# Patient Record
Sex: Male | Born: 1960
Health system: Southern US, Community
[De-identification: ages and names within clinical notes are randomized; demographics above are authoritative.]

## PROBLEM LIST (undated history)

## (undated) DIAGNOSIS — E119 Type 2 diabetes mellitus without complications: Secondary | ICD-10-CM

## (undated) DIAGNOSIS — F419 Anxiety disorder, unspecified: Secondary | ICD-10-CM

## (undated) DIAGNOSIS — I10 Essential (primary) hypertension: Secondary | ICD-10-CM

## (undated) DIAGNOSIS — T7840XA Allergy, unspecified, initial encounter: Secondary | ICD-10-CM

## (undated) HISTORY — DX: Essential (primary) hypertension: I10

## (undated) HISTORY — PX: HERNIA REPAIR: SHX51

## (undated) HISTORY — DX: Allergy, unspecified, initial encounter: T78.40XA

## (undated) HISTORY — PX: NASAL SINUS SURGERY: SHX719

## (undated) HISTORY — PX: PILONIDAL CYST / SINUS EXCISION: SUR543

## (undated) HISTORY — DX: Anxiety disorder, unspecified: F41.9

---

## 2000-08-24 ENCOUNTER — Emergency Department (HOSPITAL_COMMUNITY): Admission: EM | Admit: 2000-08-24 | Discharge: 2000-08-25 | Payer: Self-pay | Admitting: Emergency Medicine

## 2001-09-04 ENCOUNTER — Encounter: Payer: Self-pay | Admitting: Otolaryngology

## 2001-09-04 ENCOUNTER — Encounter: Admission: RE | Admit: 2001-09-04 | Discharge: 2001-09-04 | Payer: Self-pay | Admitting: Otolaryngology

## 2004-04-11 ENCOUNTER — Ambulatory Visit: Payer: Self-pay | Admitting: Internal Medicine

## 2005-12-16 ENCOUNTER — Emergency Department (HOSPITAL_COMMUNITY): Admission: EM | Admit: 2005-12-16 | Discharge: 2005-12-16 | Payer: Self-pay | Admitting: *Deleted

## 2011-04-11 ENCOUNTER — Ambulatory Visit (INDEPENDENT_AMBULATORY_CARE_PROVIDER_SITE_OTHER): Payer: BC Managed Care – PPO | Admitting: Internal Medicine

## 2011-04-11 DIAGNOSIS — R05 Cough: Secondary | ICD-10-CM

## 2011-04-11 DIAGNOSIS — R059 Cough, unspecified: Secondary | ICD-10-CM

## 2011-04-11 DIAGNOSIS — J329 Chronic sinusitis, unspecified: Secondary | ICD-10-CM

## 2011-04-11 DIAGNOSIS — R51 Headache: Secondary | ICD-10-CM

## 2011-04-11 DIAGNOSIS — J019 Acute sinusitis, unspecified: Secondary | ICD-10-CM

## 2011-04-11 MED ORDER — AZITHROMYCIN 500 MG PO TABS: 500.0000 mg | ORAL_TABLET | Freq: Every day | ORAL | Status: AC

## 2011-04-11 MED ORDER — HYDROCODONE-ACETAMINOPHEN 7.5-500 MG/15ML PO SOLN: 5.0000 mL | Freq: Four times a day (QID) | ORAL | Status: AC | PRN

## 2011-04-11 NOTE — Progress Notes (Signed)
  Subjective:    Patient ID: Austin Kent, male    DOB: Feb 24, 1961, 51 y.o.   MRN: [id number]  HPI Last week had fever and a cold. Improved, then sinuses started aching and nose worse congested. Has cough but no fever , SOB,CP. Discharge is now green and am sputum is green.   Review of Systems    Neg. Objective:   Physical Exam  Constitutional: He is oriented to person, place, and time. Vital signs are normal. He appears well-developed and well-nourished.       obese  HENT:  Nose: Mucosal edema, rhinorrhea and sinus tenderness present. Right sinus exhibits maxillary sinus tenderness and frontal sinus tenderness. Left sinus exhibits maxillary sinus tenderness and frontal sinus tenderness.  Cardiovascular: Normal rate, regular rhythm and normal heart sounds.   Pulmonary/Chest: Effort normal and breath sounds normal.  Neurological: He is alert and oriented to person, place, and time.  Skin: Skin is warm and dry.  Psychiatric: He has a normal mood and affect.          Assessment & Plan:  Sinusitis Cough  Zithromax 500 5d Lortab elixir 6oz  Resp. Care given  RTC prn 3-5d, do CBC and CXR

## 2012-03-18 ENCOUNTER — Ambulatory Visit (INDEPENDENT_AMBULATORY_CARE_PROVIDER_SITE_OTHER): Payer: BC Managed Care – PPO | Admitting: Family Medicine

## 2012-03-18 VITALS — BP 155/105 | HR 90 | Temp 98.2°F | Resp 16 | Ht 71.0 in | Wt 248.0 lb

## 2012-03-18 DIAGNOSIS — F411 Generalized anxiety disorder: Secondary | ICD-10-CM

## 2012-03-18 DIAGNOSIS — I10 Essential (primary) hypertension: Secondary | ICD-10-CM

## 2012-03-18 DIAGNOSIS — F419 Anxiety disorder, unspecified: Secondary | ICD-10-CM

## 2012-03-18 MED ORDER — CITALOPRAM HYDROBROMIDE 20 MG PO TABS: 20.0000 mg | ORAL_TABLET | Freq: Every day | ORAL | Status: DC

## 2012-03-18 MED ORDER — CHLORTHALIDONE 25 MG PO TABS: 25.0000 mg | ORAL_TABLET | Freq: Every day | ORAL | Status: DC

## 2012-03-18 NOTE — Progress Notes (Signed)
Patient ID: Austin Kent MRN: [id number], DOB: 13-Jun-1960, 52 y.o. Date of Encounter: 03/18/2012, 10:31 AM  Primary Physician: No primary provider on file.  Chief Complaint: HTN  HPI: 52 y.o. year old male with history below presents for hypertension follow up.  He took blood pressure medicine several years ago but developed a rash and, since he lost weight and exercised, he stopped the medicine.  Nevertheless, he gained weight back Teaches music at [employer]  No CP, HA, visual changes, or focal deficits.   Also, he is having problems with social anxiety and feels stress about home and work.  His wife, son, and daughter all take anxiety meds. Sleeping well  Past Medical History  Diagnosis Date  . Allergy   . Anxiety   . Hypertension      Home Meds: Prior to Admission medications   Not on File    Allergies:  Allergies  Allergen Reactions  . Amoxicillin     History   Social History  . Marital Status: Married    Spouse Name: N/A    Number of Children: N/A  . Years of Education: N/A   Occupational History  . Not on file.   Social History Main Topics  . Smoking status: Never Smoker   . Smokeless tobacco: Not on file  . Alcohol Use: Not on file  . Drug Use: Not on file  . Sexually Active: Not on file   Other Topics Concern  . Not on file   Social History Narrative  . No narrative on file     Family History  Problem Relation Age of Onset  . Hypertension Mother   . Anxiety disorder Father   . Anxiety disorder Daughter   . Anxiety disorder Son     Review of Systems: Constitutional: negative for chills, fever, night sweats, weight changes, or fatigue  HEENT: negative for vision changes, hearing loss, congestion, rhinorrhea, ST, epistaxis, or sinus pressure Cardiovascular: negative for chest pain, palpitations, or DOE Respiratory: negative for hemoptysis, wheezing, shortness of breath, or cough Abdominal: negative for abdominal pain,  nausea, vomiting, diarrhea, or constipation Dermatological: negative for rash Neurologic: negative for headache, dizziness, or syncope All other systems reviewed and are otherwise negative with the exception to those above and in the HPI.   Physical Exam: Blood pressure 155/105, pulse 90, temperature 98.2 F (36.8 C), temperature source Oral, resp. rate 16, height 5\' 11"  (1.803 m), weight 248 lb (112.492 kg), SpO2 98.00%., Body mass index is 34.59 kg/(m^2). General: Well developed, well nourished, in no acute distress. Head: Normocephalic, atraumatic, eyes without discharge, sclera non-icteric, nares are without discharge. Bilateral auditory canals clear, TM's are without perforation, pearly grey and translucent with reflective cone of light bilaterally. Oral cavity moist, posterior pharynx without exudate, erythema, peritonsillar abscess, or post nasal drip.  Neck: Supple. No thyromegaly. Full ROM. No lymphadenopathy. No carotid bruits. Lungs: Clear bilaterally to auscultation without wheezes, rales, or rhonchi. Breathing is unlabored. Heart: RRR with S1 S2. No murmurs, rubs, or gallops appreciated.  Abdomen: Soft, non-tender, non-distended with normoactive bowel sounds. No hepatosplenomegaly. No rebound/guarding. No obvious abdominal masses. Msk:  Strength and tone normal for age. Extremities/Skin: Warm and dry. No clubbing or cyanosis. No edema. No rashes or suspicious lesions. Distal pulses 2+ and equal bilaterally. Neuro: Alert and oriented X 3. Moves all extremities spontaneously. Gait is normal. CNII-XII grossly in tact. DTR 2+, cerebellar function intact. Rhomberg normal. Psych:  Responds to questions appropriately with a normal affect.  ASSESSMENT AND PLAN:  52 y.o. year old male with hypertension and anxiety. -  Signed, Elvina Sidle, MD 03/18/2012 10:31 AM

## 2012-03-18 NOTE — Patient Instructions (Addendum)
Social Anxiety Disorder Almost everyone can feel some degree of discomfort in a given social situation. However, when you feel extreme fear of social encounters and it begins to interfere with your daily functioning, you have social anxiety disorder. There are two types of this disorder.  If you have the first type, you are extremely anxious in only a few specific situations. For instance, you become anxious when answering a question out loud in class or presenting at work.  If you have the second type, you experience overwhelming worry in most or all social experiences. This may include everything from going to a doctor's appointment, to eating in a restaurant, to entering a crowded room. When this disorder happens in very young children, it may be from a new babysitter or stranger that the child is not used to. In the very young it may show up as crying, tantrums, or withdrawal.  Most adults with social anxiety were shy and timid as children. If left untreated, these adults may appear quiet and passive in social situations. They may be highly sensitive to the criticism and disapproval of others. They may have no close friends outside of first degree relatives. They may be fearful of saying or doing something foolish or becoming emotional in front of others. As a result of these fears, they may avoid most social encounters and select jobs and personal activities that allow them to isolate themselves from others.  CAUSES  This disorder can result from the combination of several factors.   Your genetic makeup affects how sensitive you are and how much stress you can tolerate.  How you were raised as a child also plays a part. Research suggests that children raised with overprotective parents, excessive expectations, overly critical parents, low assertiveness, and/or emotional insecurity have increased feelings of anxiety.  A traumatic life event can also contribute to social anxiety; for example, being  pointed out and shamed in public or being repeatedly bullied. SYMPTOMS  This disorder is characterized by a fear of social situations. The anxiety is marked by:  Apprehension.  Nervousness.  A feeling of unease, worry, or tension. Anxiety may also be reflected in:  Blushing.  Restlessness.  Trembling.  Shortness of breath.  Sweating.  Muscle tics and twitches. At higher levels of anxiety, there also may be:  Increased heart rate.  A rise in blood pressure.  Rapid breathing.  Muscle tension. Experiencing these uncomfortable symptoms often enough can cause a person to avoid social situations. This can cause many problems in the anxiety sufferer's life.  TREATMENT  There are many types of treatment available for social anxiety disorder.  Group therapy allows you to see that you are not alone with these problems.  Individual therapy helps you address anxiety issues with a caring professional.  Relaxation techniques may be used as tools to help you overcome fear.  Hypnosis may help change the way you think about social settings.  Numerous medications are available that your caregiver can prescribe to help during a difficult time. Medications can be used for a brief period of time. The goal of this treatment is to help recondition you so that once you quit taking the medications, your anxiety will not return. PROGNOSIS  Social anxiety disorder is a common problem that is very treatable. Individuals who participate in treatment have a very high success rate. When treated properly, the prognosis is very good to excellent. Document Released: 01/11/2005 Document Revised: 05/07/2011 Document Reviewed: 01/07/2007 [facility] Patient Information 2013 [place], [place].  Hypertension As your heart beats, it forces blood through your arteries. This force is your blood pressure. If the pressure is too high, it is called hypertension (HTN) or high blood pressure. HTN is dangerous because  you may have it and not know it. High blood pressure may mean that your heart has to work harder to pump blood. Your arteries may be narrow or stiff. The extra work puts you at risk for heart disease, stroke, and other problems.  Blood pressure consists of two numbers, a higher number over a lower, 110/72, for example. It is stated as "110 over 72." The ideal is below 120 for the top number (systolic) and under 80 for the bottom (diastolic). Write down your blood pressure today. You should pay close attention to your blood pressure if you have certain conditions such as:  Heart failure.  Prior heart attack.  Diabetes  Chronic kidney disease.  Prior stroke.  Multiple risk factors for heart disease. To see if you have HTN, your blood pressure should be measured while you are seated with your arm held at the level of the heart. It should be measured at least twice. A one-time elevated blood pressure reading (especially in the Emergency Department) does not mean that you need treatment. There may be conditions in which the blood pressure is different between your right and left arms. It is important to see your caregiver soon for a recheck. Most people have essential hypertension which means that there is not a specific cause. This type of high blood pressure may be lowered by changing lifestyle factors such as:  Stress.  Smoking.  Lack of exercise.  Excessive weight.  Drug/tobacco/alcohol use.  Eating less salt. Most people do not have symptoms from high blood pressure until it has caused damage to the body. Effective treatment can often prevent, delay or reduce that damage. TREATMENT  When a cause has been identified, treatment for high blood pressure is directed at the cause. There are a large number of medications to treat HTN. These fall into several categories, and your caregiver will help you select the medicines that are best for you. Medications may have side effects. You should  review side effects with your caregiver. If your blood pressure stays high after you have made lifestyle changes or started on medicines,   Your medication(s) may need to be changed.  Other problems may need to be addressed.  Be certain you understand your prescriptions, and know how and when to take your medicine.  Be sure to follow up with your caregiver within the time frame advised (usually within two weeks) to have your blood pressure rechecked and to review your medications.  If you are taking more than one medicine to lower your blood pressure, make sure you know how and at what times they should be taken. Taking two medicines at the same time can result in blood pressure that is too low. SEEK IMMEDIATE MEDICAL CARE IF:  You develop a severe headache, blurred or changing vision, or confusion.  You have unusual weakness or numbness, or a faint feeling.  You have severe chest or abdominal pain, vomiting, or breathing problems. MAKE SURE YOU:   Understand these instructions.  Will watch your condition.  Will get help right away if you are not doing well or get worse. Document Released: 02/12/2005 Document Revised: 05/07/2011 Document Reviewed: 10/03/2007 [facility] Patient Information 2013 [place], [place].

## 2012-03-24 ENCOUNTER — Telehealth: Payer: Self-pay

## 2012-03-24 NOTE — Addendum Note (Signed)
Addended by: Godfrey Pick on: 03/24/2012 04:06 PM   Modules accepted: Orders

## 2012-03-24 NOTE — Telephone Encounter (Signed)
Note states pt has been on HTN medication in the past, what was he on previously?

## 2012-03-24 NOTE — Telephone Encounter (Signed)
I called patient and he is c/o dizziness and heart palpatations, dry mouth. He d/c the Chlorthalidone and feels much better, symptoms have resolved. Patient wants to know if we can recommend an alternative. Please advise.

## 2012-03-24 NOTE — Telephone Encounter (Signed)
Dr. Milus Glazier: patient was in to see you Tuesday of last week and was prescribed a medication that is giving him several unpleasant side effects. Please call him to advise at [phone]

## 2012-03-25 MED ORDER — LISINOPRIL 20 MG PO TABS: 20.0000 mg | ORAL_TABLET | Freq: Every day | ORAL | Status: DC

## 2012-03-25 NOTE — Telephone Encounter (Signed)
He took meds 3 years ago but does not know name of meds.

## 2012-03-25 NOTE — Telephone Encounter (Signed)
Ok I have sent lisinopril 20mg  to the pharmacy.  Let's have him try this and follow up with [place] in 4-6 weeks so we can make sure BP is improving and adjust the dose if necessary

## 2012-03-25 NOTE — Telephone Encounter (Signed)
Called patient to advise  °

## 2012-03-25 NOTE — Addendum Note (Signed)
Addended by: Godfrey Pick on: 03/25/2012 11:15 AM   Modules accepted: Orders

## 2012-07-28 ENCOUNTER — Other Ambulatory Visit: Payer: Self-pay | Admitting: Family Medicine

## 2012-10-18 ENCOUNTER — Other Ambulatory Visit: Payer: Self-pay | Admitting: Physician Assistant

## 2015-06-29 ENCOUNTER — Telehealth: Payer: Self-pay | Admitting: Emergency Medicine

## 2015-06-29 ENCOUNTER — Ambulatory Visit (INDEPENDENT_AMBULATORY_CARE_PROVIDER_SITE_OTHER): Payer: BLUE CROSS/BLUE SHIELD | Admitting: Physician Assistant

## 2015-06-29 ENCOUNTER — Ambulatory Visit (HOSPITAL_BASED_OUTPATIENT_CLINIC_OR_DEPARTMENT_OTHER)
Admission: RE | Admit: 2015-06-29 | Discharge: 2015-06-29 | Disposition: A | Discharge status: Home / Self Care | Payer: BLUE CROSS/BLUE SHIELD | Source: Ambulatory Visit | Attending: Physician Assistant | Admitting: Physician Assistant

## 2015-06-29 ENCOUNTER — Telehealth: Payer: Self-pay | Admitting: Physician Assistant

## 2015-06-29 VITALS — BP 150/80 | HR 63 | Temp 97.4°F | Resp 16 | Ht 71.0 in | Wt 248.0 lb

## 2015-06-29 DIAGNOSIS — R112 Nausea with vomiting, unspecified: Secondary | ICD-10-CM

## 2015-06-29 DIAGNOSIS — R110 Nausea: Secondary | ICD-10-CM | POA: Diagnosis not present

## 2015-06-29 DIAGNOSIS — K8020 Calculus of gallbladder without cholecystitis without obstruction: Secondary | ICD-10-CM | POA: Insufficient documentation

## 2015-06-29 DIAGNOSIS — Z7984 Long term (current) use of oral hypoglycemic drugs: Secondary | ICD-10-CM | POA: Diagnosis not present

## 2015-06-29 DIAGNOSIS — Z880 Allergy status to penicillin: Secondary | ICD-10-CM | POA: Diagnosis not present

## 2015-06-29 DIAGNOSIS — R1011 Right upper quadrant pain: Secondary | ICD-10-CM

## 2015-06-29 DIAGNOSIS — Z79899 Other long term (current) drug therapy: Secondary | ICD-10-CM | POA: Diagnosis not present

## 2015-06-29 DIAGNOSIS — N200 Calculus of kidney: Secondary | ICD-10-CM | POA: Insufficient documentation

## 2015-06-29 DIAGNOSIS — F419 Anxiety disorder, unspecified: Secondary | ICD-10-CM | POA: Diagnosis not present

## 2015-06-29 DIAGNOSIS — I10 Essential (primary) hypertension: Secondary | ICD-10-CM | POA: Diagnosis not present

## 2015-06-29 DIAGNOSIS — R630 Anorexia: Secondary | ICD-10-CM | POA: Diagnosis not present

## 2015-06-29 DIAGNOSIS — E119 Type 2 diabetes mellitus without complications: Secondary | ICD-10-CM | POA: Diagnosis not present

## 2015-06-29 LAB — POCT CBC
Granulocyte percent: 83 %G — AB (ref 37–80)
HCT, POC: 42.1 % — AB (ref 43.5–53.7)
HEMOGLOBIN: 15.3 g/dL (ref 14.1–18.1)
LYMPH, POC: 1.5 (ref 0.6–3.4)
MCH: 32 pg — AB (ref 27–31.2)
MCHC: 36.2 g/dL — AB (ref 31.8–35.4)
MCV: 88.4 fL (ref 80–97)
MID (cbc): 0.6 (ref 0–0.9)
MPV: 6.5 fL (ref 0–99.8)
POC GRANULOCYTE: 10.3 — AB (ref 2–6.9)
POC LYMPH PERCENT: 12.1 %L (ref 10–50)
POC MID %: 4.9 % (ref 0–12)
Platelet Count, POC: 236 10*3/uL (ref 142–424)
RBC: 4.76 M/uL (ref 4.69–6.13)
RDW, POC: 12.8 %
WBC: 12.4 10*3/uL — AB (ref 4.6–10.2)

## 2015-06-29 LAB — POC MICROSCOPIC URINALYSIS (UMFC)

## 2015-06-29 LAB — POCT URINALYSIS DIP (MANUAL ENTRY)
BILIRUBIN UA: NEGATIVE
BILIRUBIN UA: NEGATIVE
Glucose, UA: NEGATIVE
Leukocytes, UA: NEGATIVE
NITRITE UA: NEGATIVE
PH UA: 6
PROTEIN UA: NEGATIVE
Spec Grav, UA: 1.02
Urobilinogen, UA: 1

## 2015-06-29 MED ORDER — PROMETHAZINE HCL 25 MG/ML IJ SOLN: 50.0000 mg | Freq: Once | INTRAMUSCULAR | Status: DC

## 2015-06-29 MED ORDER — ONDANSETRON HCL 4 MG/2ML IJ SOLN
2.0000 mg | Freq: Once | INTRAMUSCULAR | Status: AC
  Administered 2015-06-29: 2 mg via INTRAMUSCULAR

## 2015-06-29 MED ORDER — KETOROLAC TROMETHAMINE 60 MG/2ML IM SOLN
60.0000 mg | Freq: Once | INTRAMUSCULAR | Status: AC
  Administered 2015-06-29: 60 mg via INTRAMUSCULAR

## 2015-06-29 NOTE — Telephone Encounter (Signed)
Spoke with patient and provided the results.  Will refer to general surgery for multiple gall stones. Pain remains controlled, he doesn't need to be seen urgently, as there is no GB wall thickening and no fluid. There is evidence of fatty liver and a stone 3-4 mm at the upper pole of the RIGHT kidney.  If pain recurs overnight, go to the ED.

## 2015-06-29 NOTE — Progress Notes (Signed)
Patient ID: Austin Kent, male     DOB: [birth year], 55 y.o.    MRN: [id number]  PCP: No primary care provider on file.  Chief Complaint  Patient presents with  . SIDE PAIN    rt, x today  . Nausea    Subjective:    HPI  Presents for evaluation of possible appendicitis. He is accompanied by his son.  This is the first visit at this facility.  The patient awoke this morning and noted some RUQ pain. It was mild, so he proceeded with his routine and took one of his sons to school. He planned to eat breakfast after dropping his son off, but developed nausea.  Both the pain and the nausea have worsened throughout the day.  No fever/chills. Previous hernia repair and pilonidal cyst excision. No previous pain like this or in this area. No indigestion/heartburn or increased belching. Has not vomited. No urinary burning, increased frequency or urgency. No hematuria.   Prior to Admission medications   Medication Sig Start Date End Date Taking? Authorizing Provider  citalopram (CELEXA) 20 MG tablet TAKE 1 TABLET BY MOUTH EVERY DAY 10/18/12  Yes Morrell RiddleSarah L Weber, PA-C  LOSARTAN POTASSIUM PO Take by mouth.   Yes Historical Provider, MD  METFORMIN HCL PO Take by mouth.   Yes Historical Provider, MD     Allergies  Allergen Reactions  . Amoxicillin      Patient Active Problem List   Diagnosis Date Noted  . Hypertension 03/18/2012     Family History  Problem Relation Age of Onset  . Hypertension Mother   . Anxiety disorder Father   . Anxiety disorder Daughter   . Anxiety disorder Son      Social History   Social History  . Marital Status: Married    Spouse Name: N/A  . Number of Children: N/A  . Years of Education: N/A   Occupational History  . Not on file.   Social History Main Topics  . Smoking status: Never Smoker   . Smokeless tobacco: Never Used  . Alcohol Use: No  . Drug Use: No  . Sexual Activity: Not on file   Other Topics Concern  . Not  on file   Social History Narrative        Review of Systems  Constitutional: Negative for fever and chills.  Respiratory: Negative for cough, shortness of breath and wheezing.   Cardiovascular: Negative for chest pain and palpitations.  Gastrointestinal: Positive for nausea and abdominal pain (RUQ). Negative for vomiting, diarrhea and constipation.  Genitourinary: Negative for dysuria, urgency, frequency and hematuria.  Musculoskeletal: Negative for myalgias and back pain.         Objective:  Physical Exam  Constitutional: He is oriented to person, place, and time. He appears well-developed and well-nourished. He is active and cooperative. He appears distressed.  BP 150/80 mmHg  Pulse 63  Temp(Src) 97.4 F (36.3 C) (Oral)  Resp 16  Ht 5\' 11"  (1.803 m)  Wt 248 lb (112.492 kg)  BMI 34.60 kg/m2  SpO2 100%  HENT:  Head: Normocephalic and atraumatic.  Right Ear: Hearing normal.  Left Ear: Hearing normal.  Eyes: Conjunctivae are normal. No scleral icterus.  Neck: Normal range of motion. Neck supple. No thyromegaly present.  Cardiovascular: Normal rate, regular rhythm and normal heart sounds.   Pulses:      Radial pulses are 2+ on the right side, and 2+ on the left side.  Pulmonary/Chest: Effort normal and  breath sounds normal.  Abdominal: Soft. Bowel sounds are normal. He exhibits no distension and no mass. There is no hepatosplenomegaly. There is tenderness in the right upper quadrant. There is no rebound and no guarding.  Lymphadenopathy:       Head (right side): No tonsillar, no preauricular, no posterior auricular and no occipital adenopathy present.       Head (left side): No tonsillar, no preauricular, no posterior auricular and no occipital adenopathy present.    He has no cervical adenopathy.       Right: No supraclavicular adenopathy present.       Left: No supraclavicular adenopathy present.  Neurological: He is alert and oriented to person, place, and time. No  sensory deficit.  Skin: Skin is warm and intact. No rash noted. He is diaphoretic. No cyanosis or erythema. Nails show no clubbing.  Psychiatric: He has a normal mood and affect. His speech is normal and behavior is normal.    Toradol and ondansetron administered IM with good benefit, near resolution of pain, complete resolution of nausea.   Results for orders placed or performed in visit on 06/29/15  POCT CBC  Result Value Ref Range   WBC 12.4 (A) 4.6 - 10.2 K/uL   Lymph, poc 1.5 0.6 - 3.4   POC LYMPH PERCENT 12.1 10 - 50 %L   MID (cbc) 0.6 0 - 0.9   POC MID % 4.9 0 - 12 %M   POC Granulocyte 10.3 (A) 2 - 6.9   Granulocyte percent 83.0 (A) 37 - 80 %G   RBC 4.76 4.69 - 6.13 M/uL   Hemoglobin 15.3 14.1 - 18.1 g/dL   HCT, POC 16.1 (A) 09.6 - 53.7 %   MCV 88.4 80 - 97 fL   MCH, POC 32.0 (A) 27 - 31.2 pg   MCHC 36.2 (A) 31.8 - 35.4 g/dL   RDW, POC 04.5 %   Platelet Count, POC 236 142 - 424 K/uL   MPV 6.5 0 - 99.8 fL  POCT urinalysis dipstick  Result Value Ref Range   Color, UA yellow yellow   Clarity, UA clear clear   Glucose, UA negative negative   Bilirubin, UA negative negative   Ketones, POC UA negative negative   Spec Grav, UA 1.020    Blood, UA trace-intact (A) negative   pH, UA 6.0    Protein Ur, POC negative negative   Urobilinogen, UA 1.0    Nitrite, UA Negative Negative   Leukocytes, UA Negative Negative  POCT Microscopic Urinalysis ([facility])  Result Value Ref Range   WBC,UR,HPF,POC None None WBC/hpf   RBC,UR,HPF,POC None None RBC/hpf   Bacteria None None, Too numerous to count   Mucus Present (A) Absent   Epithelial Cells, UR Per Microscopy Few (A) None, Too numerous to count cells/hpf          Assessment & Plan:  1. Right upper quadrant pain 2. Nausea and vomiting, intractability of vomiting not specified, unspecified vomiting type Suspect gallbladder disease. [place] now. - ondansetron (ZOFRAN) injection 2 mg; Inject 1 mL (2 mg total) into the muscle once. -  ketorolac (TORADOL) injection 60 mg; Inject 2 mLs (60 mg total) into the muscle once. - POCT CBC - POCT urinalysis dipstick - POCT Microscopic Urinalysis ([facility]) - US Abdomen Limited RUQ; Future    Fernande Bras, PA-C Physician Assistant-Certified [facility]

## 2015-06-29 NOTE — Telephone Encounter (Signed)
Pt scheduled for STAT RUQ U/S @ [facility]

## 2015-06-29 NOTE — Patient Instructions (Addendum)
Please proceed to [facility] [place] now. [address], [place], [place] Go to Financial traderadmitting and register. Then, follow directions to the Ultrasound Department.    IF you received an x-ray today, you will receive an invoice from [facility]. Please contact [facility] at [phone] with questions or concerns regarding your invoice.   IF you received labwork today, you will receive an invoice from [facility]. Please contact [facility] at [phone] with questions or concerns regarding your invoice.   Our billing staff will not be able to assist you with questions regarding bills from these companies.  You will be contacted with the lab results as soon as they are available. The fastest way to get your results is to activate your My Chart account. Instructions are located on the last page of this paperwork. If you have not heard from us regarding the results in 2 weeks, please contact this office.

## 2015-06-30 ENCOUNTER — Encounter (HOSPITAL_COMMUNITY): Payer: Self-pay

## 2015-06-30 ENCOUNTER — Telehealth: Payer: Self-pay

## 2015-06-30 ENCOUNTER — Emergency Department (HOSPITAL_COMMUNITY)
Admission: EM | Admit: 2015-06-30 | Discharge: 2015-06-30 | Disposition: A | Discharge status: Home / Self Care | Payer: BLUE CROSS/BLUE SHIELD | Attending: Emergency Medicine | Admitting: Emergency Medicine

## 2015-06-30 DIAGNOSIS — Z880 Allergy status to penicillin: Secondary | ICD-10-CM | POA: Insufficient documentation

## 2015-06-30 DIAGNOSIS — E119 Type 2 diabetes mellitus without complications: Secondary | ICD-10-CM | POA: Insufficient documentation

## 2015-06-30 DIAGNOSIS — R630 Anorexia: Secondary | ICD-10-CM | POA: Insufficient documentation

## 2015-06-30 DIAGNOSIS — I10 Essential (primary) hypertension: Secondary | ICD-10-CM | POA: Insufficient documentation

## 2015-06-30 DIAGNOSIS — F419 Anxiety disorder, unspecified: Secondary | ICD-10-CM | POA: Insufficient documentation

## 2015-06-30 DIAGNOSIS — K8020 Calculus of gallbladder without cholecystitis without obstruction: Secondary | ICD-10-CM | POA: Insufficient documentation

## 2015-06-30 DIAGNOSIS — Z79899 Other long term (current) drug therapy: Secondary | ICD-10-CM | POA: Insufficient documentation

## 2015-06-30 DIAGNOSIS — Z7984 Long term (current) use of oral hypoglycemic drugs: Secondary | ICD-10-CM | POA: Insufficient documentation

## 2015-06-30 HISTORY — DX: Type 2 diabetes mellitus without complications: E11.9

## 2015-06-30 LAB — CBC WITH DIFFERENTIAL/PLATELET
BASOS PCT: 0 %
Basophils Absolute: 0 10*3/uL (ref 0.0–0.1)
EOS ABS: 0.1 10*3/uL (ref 0.0–0.7)
EOS PCT: 1 %
HCT: 41.6 % (ref 39.0–52.0)
HEMOGLOBIN: 14.2 g/dL (ref 13.0–17.0)
Lymphocytes Relative: 6 %
Lymphs Abs: 1 10*3/uL (ref 0.7–4.0)
MCH: 31.3 pg (ref 26.0–34.0)
MCHC: 34.1 g/dL (ref 30.0–36.0)
MCV: 91.6 fL (ref 78.0–100.0)
Monocytes Absolute: 1.1 10*3/uL — ABNORMAL HIGH (ref 0.1–1.0)
Monocytes Relative: 7 %
NEUTROS PCT: 86 %
Neutro Abs: 13 10*3/uL — ABNORMAL HIGH (ref 1.7–7.7)
PLATELETS: 181 10*3/uL (ref 150–400)
RBC: 4.54 MIL/uL (ref 4.22–5.81)
RDW: 13 % (ref 11.5–15.5)
WBC: 15.2 10*3/uL — AB (ref 4.0–10.5)

## 2015-06-30 LAB — COMPREHENSIVE METABOLIC PANEL
ALBUMIN: 4 g/dL (ref 3.5–5.0)
ALK PHOS: 38 U/L (ref 38–126)
ALT: 29 U/L (ref 17–63)
ANION GAP: 9 (ref 5–15)
AST: 21 U/L (ref 15–41)
BUN: 17 mg/dL (ref 6–20)
CALCIUM: 9.3 mg/dL (ref 8.9–10.3)
CHLORIDE: 105 mmol/L (ref 101–111)
CO2: 25 mmol/L (ref 22–32)
CREATININE: 0.89 mg/dL (ref 0.61–1.24)
GFR calc non Af Amer: >60 mL/min (ref >60)
GLUCOSE: 159 mg/dL — AB (ref 65–99)
Potassium: 3.9 mmol/L (ref 3.5–5.1)
SODIUM: 139 mmol/L (ref 135–145)
Total Bilirubin: 1.4 mg/dL — ABNORMAL HIGH (ref 0.3–1.2)
Total Protein: 6.8 g/dL (ref 6.5–8.1)

## 2015-06-30 LAB — LIPASE, BLOOD: Lipase: 33 U/L (ref 11–51)

## 2015-06-30 MED ORDER — HYDROCODONE-ACETAMINOPHEN 5-325 MG PO TABS: 2.0000 | ORAL_TABLET | ORAL | Status: DC | PRN

## 2015-06-30 MED ORDER — ONDANSETRON HCL 4 MG PO TABS: 4.0000 mg | ORAL_TABLET | Freq: Four times a day (QID) | ORAL | Status: DC

## 2015-06-30 MED ORDER — KETOROLAC TROMETHAMINE 30 MG/ML IJ SOLN
30.0000 mg | Freq: Once | INTRAMUSCULAR | Status: AC
  Administered 2015-06-30: 30 mg via INTRAVENOUS
  Filled 2015-06-30: qty 1

## 2015-06-30 MED ORDER — SODIUM CHLORIDE 0.9 % IV BOLUS (SEPSIS)
1000.0000 mL | Freq: Once | INTRAVENOUS | Status: AC
  Administered 2015-06-30: 1000 mL via INTRAVENOUS

## 2015-06-30 MED ORDER — ONDANSETRON HCL 4 MG/2ML IJ SOLN
4.0000 mg | Freq: Once | INTRAMUSCULAR | Status: AC
  Administered 2015-06-30: 4 mg via INTRAVENOUS
  Filled 2015-06-30: qty 2

## 2015-06-30 MED ORDER — IBUPROFEN 800 MG PO TABS: 800.0000 mg | ORAL_TABLET | Freq: Three times a day (TID) | ORAL | Status: DC | PRN

## 2015-06-30 NOTE — Telephone Encounter (Signed)
Pt is wanting to talk with Chelle about his gallbladder issues   Best number [phone]

## 2015-06-30 NOTE — ED Notes (Signed)
MD at bedside. 

## 2015-06-30 NOTE — ED Notes (Signed)
Pt arrives ambulatory with c/o RUQ pain radiating into back. Pt states pain started yesterday and he went to UC and eventually got an US indicating gall stones and kidney stones.Pain wasrelioeved last night with pain meds(toradol) but returned in early hours of am.

## 2015-06-30 NOTE — ED Provider Notes (Signed)
CSN: [id number]     Arrival date & time 06/30/15  4098 History   First MD Initiated Contact with Patient 06/30/15 [phone]     Chief Complaint  Patient presents with  . Abdominal Pain     (Consider location/radiation/quality/duration/timing/severity/associated sxs/prior Treatment) HPI 55 y.o. male with a history of hypertension, diabetes, presents to the ED noting the onset of right upper quadrant abdominal pain radiating to his mid back yesterday morning. He has had associated nausea and non-bloody vomiting, but denies any diarrhea, hematuria, radiation of pain to his groin, nor hx of kidney stones. He was evaluated at an urgent care clinic yesterday with labs drawn significant for mild leukocytosis of 12.4, but other wise reassuring results, and was sent for an outpatient [place] of his RUQ. This resulted showing evidence of cholelithiasis without evidence of cholecystitis or obstruction. He was not rx'd any medications and was advised to follow up with surgery in clinic for further evaluation of his sx. He had complete resolution of his pain yesterday with a dose of toradol. He presents to the ED today noting the return of his RUQ pain sx and nausea this morning. He denies any fever, chills. No hx of prior abdominal surgery.      Past Medical History  Diagnosis Date  . Allergy   . Anxiety   . Hypertension   . Diabetes mellitus without complication [facility])    Past Surgical History  Procedure Laterality Date  . Hernia repair    . Pilonidal cyst / sinus excision    . Nasal sinus surgery     Family History  Problem Relation Age of Onset  . Hypertension Mother   . Anxiety disorder Father   . Anxiety disorder Daughter   . Anxiety disorder Son    Social History  Substance Use Topics  . Smoking status: Never Smoker   . Smokeless tobacco: Never Used  . Alcohol Use: No    Review of Systems  Constitutional: Positive for activity change and appetite change. Negative for fever and chills.  HENT:  Negative for sore throat.   Respiratory: Negative for chest tightness and shortness of breath.   Cardiovascular: Negative for chest pain.  Gastrointestinal: Positive for nausea, vomiting and abdominal pain. Negative for diarrhea, constipation and blood in stool.  Genitourinary: Negative for dysuria, frequency, hematuria, flank pain and penile pain.  Musculoskeletal: Negative for back pain and neck pain.  Skin: Negative for rash.  Neurological: Negative for dizziness, syncope, light-headedness and headaches.  All other systems reviewed and are negative.     Allergies  Amoxicillin  Home Medications   Prior to Admission medications   Medication Sig Start Date End Date Taking? Authorizing Provider  citalopram (CELEXA) 20 MG tablet TAKE 1 TABLET BY MOUTH EVERY DAY 10/18/12  Yes Morrell Riddle, PA-C  glimepiride (AMARYL) 2 MG tablet Take 2 mg by mouth daily with breakfast.   Yes Historical Provider, MD  losartan-hydrochlorothiazide (HYZAAR) 50-12.5 MG tablet Take 1 tablet by mouth daily. 06/27/15  Yes Historical Provider, MD  metFORMIN (GLUCOPHAGE) 1000 MG tablet Take 1,000 mg by mouth 2 (two) times daily. 06/27/15  Yes Historical Provider, MD  HYDROcodone-acetaminophen (NORCO/VICODIN) 5-325 MG tablet Take 2 tablets by mouth every 4 (four) hours as needed. 06/30/15   Francoise Ceo, DO  ibuprofen (ADVIL,MOTRIN) 800 MG tablet Take 1 tablet (800 mg total) by mouth every 8 (eight) hours as needed for moderate pain. 06/30/15   Francoise Ceo, DO  LOSARTAN POTASSIUM PO Take  by mouth.    Historical Provider, MD  METFORMIN HCL PO Take by mouth.    Historical Provider, MD  ondansetron (ZOFRAN) 4 MG tablet Take 1 tablet (4 mg total) by mouth every 6 (six) hours. 06/30/15   Francoise CeoWarren S Salisa Broz, DO   BP 142/70 mmHg  Pulse 82  Temp(Src) 98.1 F (36.7 C) (Oral)  Resp 16  Ht 5\' 11"  (1.803 m)  Wt 107.956 kg  BMI 33.21 kg/m2  SpO2 97% Physical Exam  Constitutional: He is oriented to person, place, and time. He  appears well-developed and well-nourished. No distress.  HENT:  Head: Normocephalic and atraumatic.  Nose: Nose normal.  Mouth/Throat: Oropharynx is clear and moist.  Eyes: Conjunctivae and EOM are normal. Pupils are equal, round, and reactive to light.  Neck: Neck supple.  Cardiovascular: Normal rate, regular rhythm, normal heart sounds and intact distal pulses.   Pulmonary/Chest: Effort normal and breath sounds normal. He exhibits no tenderness.  Abdominal: Soft. Bowel sounds are normal. He exhibits no distension. There is tenderness in the right upper quadrant. There is positive Murphy's sign. There is no rigidity, no rebound, no guarding, no CVA tenderness and no tenderness at McBurney's point.  Musculoskeletal: He exhibits no edema or tenderness.  Neurological: He is alert and oriented to person, place, and time. No cranial nerve deficit.  Skin: Skin is warm and dry. He is not diaphoretic.    ED Course  Procedures (including critical care time) Labs Review Labs Reviewed  CBC WITH DIFFERENTIAL/PLATELET - Abnormal; Notable for the following:    WBC 15.2 (*)    Neutro Abs 13.0 (*)    Monocytes Absolute 1.1 (*)    All other components within normal limits  COMPREHENSIVE METABOLIC PANEL - Abnormal; Notable for the following:    Glucose, Bld 159 (*)    Total Bilirubin 1.4 (*)    All other components within normal limits  LIPASE, BLOOD    Imaging Review Kore[place] Abdomen Limited Ruq  06/29/2015  CLINICAL DATA:  Sudden onset of right upper quadrant pain this morning. Nausea and vomiting. EXAM: US ABDOMEN LIMITED - RIGHT UPPER QUADRANT COMPARISON:  None. FINDINGS: Gallbladder: Multiple calculi within the gallbladder lumen. The largest measures 7 mm. No gallbladder wall thickening or pericholecystic fluid. Common bile duct: Diameter: 3.1 mm.  Not optimally visible. Liver: There is generalized echogenicity of hepatic parenchyma without focal lesion. This may represent fatty infiltration.  Incidental findings include an upper pole right collecting system renal calculus measuring 3 x 4 mm. IMPRESSION: Cholelithiasis.  Right nephrolithiasis. Electronically Signed   By: Ellery Plunkaniel R Mitchell M.D.   On: 06/29/2015 18:18   I have personally reviewed and evaluated these images and lab results as part of my medical decision-making.   EKG Interpretation None      MDM  55 y.o. male with hx of RUQ US significant for cholelithiasis without cholecystitis yesterday presents to the ED noting return of sx this AM after having resolution of sx yesterday after a dose of toradol. On exam here he has RUQ pain with + murphy's but no guarding or rebound. He had complete resolution of his sx again in the ED with toradol. A BSUS was done and showed cholelithiasis with no new pericholecystic fluid, wall thickening or biliary duct dilation. Labs returned showing slightly increased leukocytosis of 15.2 from prior, but otherwise reassuring with normal LFTs and lipase. He was rx'd ibuprofen, norco, and zofran for furtehr symptomatic relief, advised to eat a low fat diet, and to  follow up closely with surgery as an outpatient for further evaluation of his symptoms. This plan was discussed with the patient and his wife at the bedside and they stated both understanding and agreement with this plan.  Final diagnoses:  Symptomatic cholelithiasis        Francoise Ceo, DO 06/30/15 2052  Lyndal Pulley, MD 07/01/15 [phone]

## 2015-06-30 NOTE — ED Notes (Signed)
Pt states understanding of instructions. 

## 2015-06-30 NOTE — Telephone Encounter (Signed)
Patient spouse is calling to speak with Chelle because patient is unable to get the gallbladder surgery sooner than 10 weeks. She wants to know if there are other options since the patient is in severe pain.  [phone] Lupita Leash(Donna)

## 2015-06-30 NOTE — ED Provider Notes (Signed)
I saw and evaluated the patient, reviewed the resident's note and I agree with the findings and plan. Please see associated encounter note.   EKG Interpretation None      55 y.o. male presents with recurrent RUQ pain. Suspect symptomatic cholelithiasis. WBC is uptrending but clinical appearance is favorable for lack of cholecystitis. No fluid or thickening on bedside study. Plan to follow up with general surgery and return precautions discussed for worsening or new concerning symptoms.   Emergency Focused Ultrasound Exam Limited ultrasound of the gallbladder and common bile duct.   Performed and interpreted by Dr. Clydene PughKnott Indication: Right upper quadrant abdominal pain Multiple views of the gallbladder and common bile duct are obtained with a multi-frequency probe.  Findings: + stones, no pericholecystic fluid, no gallbladder wall thickening, no CBD dilation Interpretation: cholelithiasis, no cholecystitis or choledocholithiasis. Images archived electronically.  CPT Code [id number]   Lyndal Pulleyaniel Shawntia Mangal, MD 07/01/15 [phone]

## 2015-06-30 NOTE — Discharge Instructions (Signed)
Biliary Colic °Biliary colic is a pain in the upper abdomen. The pain: °· Is usually felt on the right side of the abdomen, but it may also be felt in the center of the abdomen, just below the breastbone (sternum). °· May spread back toward the right shoulder blade. °· May be steady or irregular. °· May be accompanied by nausea and vomiting. °Most of the time, the pain goes away in 1-5 hours. After the most intense pain passes, the abdomen may continue to ache mildly for about 24 hours. °Biliary colic is caused by a blockage in the bile duct. The bile duct is a pathway that carries bile--a liquid that helps to digest fats--from the gallbladder to the small intestine. Biliary colic usually occurs after eating, when the digestive system demands bile. The pain develops when muscle cells contract forcefully to try to move the blockage so that bile can get by. °HOME CARE INSTRUCTIONS °· Take medicines only as directed by your health care provider. °· Drink enough fluid to keep your urine clear or pale yellow. °· Avoid fatty, greasy, and fried foods. These kinds of foods increase your body's demand for bile. °· Avoid any foods that make your pain worse. °· Avoid overeating. °· Avoid having a large meal after fasting. °SEEK MEDICAL CARE IF: °· You develop a fever. °· Your pain gets worse. °· You vomit. °· You develop nausea that prevents you from eating and drinking. °SEEK IMMEDIATE MEDICAL CARE IF: °· You suddenly develop a fever and shaking chills. °· You develop a yellowish discoloration (jaundice) of: °¨ Skin. °¨ Whites of the eyes. °¨ Mucous membranes. °· You have continuous or severe pain that is not relieved with medicines. °· You have nausea and vomiting that is not relieved with medicines. °· You develop dizziness or you faint. °  °This information is not intended to replace advice given to you by your health care provider. Make sure you discuss any questions you have with your health care provider. °  °Document  Released: 07/16/2005 Document Revised: 06/29/2014 Document Reviewed: 11/24/2013 °Elsevier Interactive Patient Education ©2016 [employer] ° °

## 2015-06-30 NOTE — Telephone Encounter (Signed)
Spoke with [facility]. Appointment for consultation is 07/11/2015. Can call to see if he can be worked in sooner.  However, if he's having severe pain, he should go to the ED.  Spoke with the patient's wife. He was seen in the ED this morning and told that because the GB is not infected, he needs to see General Surgery.  He is currently sleeping with pain medication.  Advised that if he is unable to manage the pain at home, he should return to the ED.  Wife expressed understanding.

## 2015-07-02 ENCOUNTER — Encounter (HOSPITAL_COMMUNITY): Payer: Self-pay | Admitting: *Deleted

## 2015-07-02 ENCOUNTER — Inpatient Hospital Stay (HOSPITAL_COMMUNITY)
Admission: EM | Admit: 2015-07-02 | Discharge: 2015-07-05 | DRG: 419 | Disposition: A | Discharge status: Home / Self Care | Payer: BLUE CROSS/BLUE SHIELD | Attending: General Surgery | Admitting: General Surgery

## 2015-07-02 DIAGNOSIS — I10 Essential (primary) hypertension: Secondary | ICD-10-CM | POA: Diagnosis not present

## 2015-07-02 DIAGNOSIS — K8000 Calculus of gallbladder with acute cholecystitis without obstruction: Secondary | ICD-10-CM | POA: Diagnosis not present

## 2015-07-02 DIAGNOSIS — E669 Obesity, unspecified: Secondary | ICD-10-CM | POA: Diagnosis not present

## 2015-07-02 DIAGNOSIS — Z7984 Long term (current) use of oral hypoglycemic drugs: Secondary | ICD-10-CM | POA: Diagnosis not present

## 2015-07-02 DIAGNOSIS — E118 Type 2 diabetes mellitus with unspecified complications: Secondary | ICD-10-CM | POA: Diagnosis present

## 2015-07-02 DIAGNOSIS — K819 Cholecystitis, unspecified: Secondary | ICD-10-CM | POA: Diagnosis not present

## 2015-07-02 DIAGNOSIS — R1011 Right upper quadrant pain: Secondary | ICD-10-CM | POA: Diagnosis not present

## 2015-07-02 DIAGNOSIS — Z79899 Other long term (current) drug therapy: Secondary | ICD-10-CM | POA: Diagnosis not present

## 2015-07-02 DIAGNOSIS — R110 Nausea: Secondary | ICD-10-CM | POA: Diagnosis not present

## 2015-07-02 DIAGNOSIS — Z6832 Body mass index (BMI) 32.0-32.9, adult: Secondary | ICD-10-CM

## 2015-07-02 DIAGNOSIS — K812 Acute cholecystitis with chronic cholecystitis: Secondary | ICD-10-CM | POA: Diagnosis not present

## 2015-07-02 DIAGNOSIS — K8012 Calculus of gallbladder with acute and chronic cholecystitis without obstruction: Secondary | ICD-10-CM | POA: Diagnosis not present

## 2015-07-02 LAB — COMPREHENSIVE METABOLIC PANEL
ALBUMIN: 3.4 g/dL — AB (ref 3.5–5.0)
ALT: 19 U/L (ref 17–63)
ANION GAP: 14 (ref 5–15)
AST: 22 U/L (ref 15–41)
Alkaline Phosphatase: 55 U/L (ref 38–126)
BUN: 11 mg/dL (ref 6–20)
CHLORIDE: 102 mmol/L (ref 101–111)
CO2: 23 mmol/L (ref 22–32)
Calcium: 9.1 mg/dL (ref 8.9–10.3)
Creatinine, Ser: 0.82 mg/dL (ref 0.61–1.24)
GFR calc Af Amer: >60 mL/min (ref >60)
GLUCOSE: 132 mg/dL — AB (ref 65–99)
POTASSIUM: 3.6 mmol/L (ref 3.5–5.1)
Sodium: 139 mmol/L (ref 135–145)
Total Bilirubin: 1.7 mg/dL — ABNORMAL HIGH (ref 0.3–1.2)
Total Protein: 7 g/dL (ref 6.5–8.1)

## 2015-07-02 LAB — CBC
HEMATOCRIT: 38 % — AB (ref 39.0–52.0)
HEMOGLOBIN: 13 g/dL (ref 13.0–17.0)
MCH: 30.9 pg (ref 26.0–34.0)
MCHC: 34.2 g/dL (ref 30.0–36.0)
MCV: 90.3 fL (ref 78.0–100.0)
Platelets: 221 10*3/uL (ref 150–400)
RBC: 4.21 MIL/uL — ABNORMAL LOW (ref 4.22–5.81)
RDW: 12.9 % (ref 11.5–15.5)
WBC: 13.5 10*3/uL — AB (ref 4.0–10.5)

## 2015-07-02 LAB — URINALYSIS, ROUTINE W REFLEX MICROSCOPIC
GLUCOSE, UA: NEGATIVE mg/dL
Hgb urine dipstick: NEGATIVE
Leukocytes, UA: NEGATIVE
Nitrite: NEGATIVE
PH: 6 (ref 5.0–8.0)
Protein, ur: 100 mg/dL — AB
SPECIFIC GRAVITY, URINE: 1.028 (ref 1.005–1.030)

## 2015-07-02 LAB — URINE MICROSCOPIC-ADD ON

## 2015-07-02 LAB — GLUCOSE, CAPILLARY: Glucose-Capillary: 105 mg/dL — ABNORMAL HIGH (ref 65–99)

## 2015-07-02 LAB — LIPASE, BLOOD: LIPASE: 18 U/L (ref 11–51)

## 2015-07-02 MED ORDER — CEFTRIAXONE SODIUM 2 G IJ SOLR
2.0000 g | Freq: Once | INTRAMUSCULAR | Status: AC
  Administered 2015-07-02: 2 g via INTRAVENOUS
  Filled 2015-07-02: qty 2

## 2015-07-02 MED ORDER — MORPHINE SULFATE (PF) 2 MG/ML IV SOLN
1.0000 mg | INTRAVENOUS | Status: DC | PRN
  Administered 2015-07-02 – 2015-07-03 (×3): 2 mg via INTRAVENOUS
  Filled 2015-07-02 (×3): qty 1

## 2015-07-02 MED ORDER — ONDANSETRON 4 MG PO TBDP: 4.0000 mg | ORAL_TABLET | Freq: Four times a day (QID) | ORAL | Status: DC | PRN

## 2015-07-02 MED ORDER — ONDANSETRON HCL 4 MG/2ML IJ SOLN: 4.0000 mg | Freq: Four times a day (QID) | INTRAMUSCULAR | Status: DC | PRN

## 2015-07-02 MED ORDER — INSULIN ASPART 100 UNIT/ML ~~LOC~~ SOLN
0.0000 [IU] | SUBCUTANEOUS | Status: DC
  Administered 2015-07-03 (×2): 2 [IU] via SUBCUTANEOUS
  Administered 2015-07-03: 8 [IU] via SUBCUTANEOUS
  Administered 2015-07-03: 2 [IU] via SUBCUTANEOUS
  Administered 2015-07-04 (×3): 3 [IU] via SUBCUTANEOUS
  Administered 2015-07-04: 5 [IU] via SUBCUTANEOUS
  Administered 2015-07-04: 8 [IU] via SUBCUTANEOUS
  Administered 2015-07-04: 5 [IU] via SUBCUTANEOUS
  Administered 2015-07-05: 2 [IU] via SUBCUTANEOUS

## 2015-07-02 MED ORDER — PANTOPRAZOLE SODIUM 40 MG IV SOLR
40.0000 mg | Freq: Every day | INTRAVENOUS | Status: DC
  Administered 2015-07-02 – 2015-07-03 (×2): 40 mg via INTRAVENOUS
  Filled 2015-07-02 (×2): qty 40

## 2015-07-02 MED ORDER — SODIUM CHLORIDE 0.9 % IV BOLUS (SEPSIS)
1000.0000 mL | Freq: Once | INTRAVENOUS | Status: AC
  Administered 2015-07-02: 1000 mL via INTRAVENOUS

## 2015-07-02 MED ORDER — ONDANSETRON HCL 4 MG/2ML IJ SOLN
4.0000 mg | Freq: Once | INTRAMUSCULAR | Status: AC
  Administered 2015-07-02: 4 mg via INTRAVENOUS
  Filled 2015-07-02: qty 2

## 2015-07-02 MED ORDER — POTASSIUM CHLORIDE IN NACL 20-0.45 MEQ/L-% IV SOLN
INTRAVENOUS | Status: DC
  Administered 2015-07-02: 20:00:00 via INTRAVENOUS
  Filled 2015-07-02 (×5): qty 1000

## 2015-07-02 MED ORDER — DIPHENHYDRAMINE HCL 50 MG/ML IJ SOLN: 12.5000 mg | Freq: Four times a day (QID) | INTRAMUSCULAR | Status: DC | PRN

## 2015-07-02 MED ORDER — DIPHENHYDRAMINE HCL 12.5 MG/5ML PO ELIX: 12.5000 mg | ORAL_SOLUTION | Freq: Four times a day (QID) | ORAL | Status: DC | PRN

## 2015-07-02 MED ORDER — ZOLPIDEM TARTRATE 5 MG PO TABS: 5.0000 mg | ORAL_TABLET | Freq: Every evening | ORAL | Status: DC | PRN

## 2015-07-02 MED ORDER — HYDROMORPHONE HCL 1 MG/ML IJ SOLN
1.0000 mg | Freq: Once | INTRAMUSCULAR | Status: AC
  Administered 2015-07-02: 1 mg via INTRAVENOUS
  Filled 2015-07-02: qty 1

## 2015-07-02 MED ORDER — SIMETHICONE 80 MG PO CHEW: 40.0000 mg | CHEWABLE_TABLET | Freq: Four times a day (QID) | ORAL | Status: DC | PRN

## 2015-07-02 MED ORDER — PROCHLORPERAZINE EDISYLATE 5 MG/ML IJ SOLN
10.0000 mg | Freq: Four times a day (QID) | INTRAMUSCULAR | Status: DC | PRN
  Filled 2015-07-02: qty 2

## 2015-07-02 MED ORDER — DEXTROSE 5 % IV SOLN
2.0000 g | INTRAVENOUS | Status: DC
  Administered 2015-07-04: 2 g via INTRAVENOUS
  Filled 2015-07-02 (×3): qty 2

## 2015-07-02 MED ORDER — HEPARIN SODIUM (PORCINE) 5000 UNIT/ML IJ SOLN
5000.0000 [IU] | Freq: Once | INTRAMUSCULAR | Status: AC
  Administered 2015-07-02: 5000 [IU] via SUBCUTANEOUS
  Filled 2015-07-02: qty 1

## 2015-07-02 NOTE — ED Provider Notes (Signed)
CSN: [id number]     Arrival date & time 07/02/15  1145 History   First MD Initiated Contact with Patient 07/02/15 1523     Chief Complaint  Patient presents with  . Abdominal Pain      HPI Pt and family reports abd pain, has been for [place] and was told it was from gallbladder. Was here on Thursday for same and dc home with meds. Reports no relief with meds and still having nausea, not eating. Was told to come in today for surgical consult.  Past Medical History  Diagnosis Date  . Allergy   . Anxiety   . Hypertension   . Diabetes mellitus without complication [facility])    Past Surgical History  Procedure Laterality Date  . Hernia repair    . Pilonidal cyst / sinus excision    . Nasal sinus surgery     Family History  Problem Relation Age of Onset  . Hypertension Mother   . Anxiety disorder Father   . Anxiety disorder Daughter   . Anxiety disorder Son    Social History  Substance Use Topics  . Smoking status: Never Smoker   . Smokeless tobacco: Never Used  . Alcohol Use: No    Review of Systems  Constitutional: Negative for fever.  Gastrointestinal: Positive for nausea and abdominal pain. Negative for vomiting.  All other systems reviewed and are negative.     Allergies  Lisinopril and Amoxicillin  Home Medications   Prior to Admission medications   Medication Sig Start Date End Date Taking? Authorizing Provider  citalopram (CELEXA) 20 MG tablet TAKE 1 TABLET BY MOUTH EVERY DAY 10/18/12  Yes Morrell Riddle, PA-C  glimepiride (AMARYL) 2 MG tablet Take 2 mg by mouth daily with breakfast.   Yes Historical Provider, MD  HYDROcodone-acetaminophen (NORCO/VICODIN) 5-325 MG tablet Take 2 tablets by mouth every 4 (four) hours as needed. Patient taking differently: Take 2 tablets by mouth every 4 (four) hours as needed for moderate pain.  06/30/15  Yes Francoise Ceo, DO  ibuprofen (ADVIL,MOTRIN) 800 MG tablet Take 1 tablet (800 mg total) by mouth every 8 (eight) hours as needed for  moderate pain. 06/30/15  Yes Francoise Ceo, DO  losartan-hydrochlorothiazide (HYZAAR) 50-12.5 MG tablet Take 1 tablet by mouth daily. 06/27/15  Yes Historical Provider, MD  metFORMIN (GLUCOPHAGE) 1000 MG tablet Take 1,000 mg by mouth 2 (two) times daily. 06/27/15  Yes Historical Provider, MD   BP 124/69 mmHg  Pulse 79  Temp(Src) 98.6 F (37 C) (Oral)  Resp 17  Ht  (1.803 m)  Wt 231 lb 11.3 oz (105.1 kg)  BMI 32.33 kg/m2  SpO2 95% Physical Exam  Constitutional: He is oriented to person, place, and time. He appears well-developed and well-nourished. No distress.  HENT:  Head: Normocephalic and atraumatic.  Eyes: Pupils are equal, round, and reactive to light.  Neck: Normal range of motion.  Cardiovascular: Normal rate and intact distal pulses.   Pulmonary/Chest: No respiratory distress.  Abdominal: Normal appearance. He exhibits no distension. There is tenderness in the right upper quadrant. There is guarding and positive Murphy's sign.  Musculoskeletal: Normal range of motion.  Neurological: He is alert and oriented to person, place, and time. No cranial nerve deficit.  Skin: Skin is warm and dry. No rash noted.  Psychiatric: He has a normal mood and affect. His behavior is normal.  Nursing note and vitals reviewed.   ED Course  Procedures (including critical care time) Labs  Review Labs Reviewed  SURGICAL PCR SCREEN - Abnormal; Notable for the following:    Staphylococcus aureus POSITIVE (*)    All other components within normal limits  COMPREHENSIVE METABOLIC PANEL - Abnormal; Notable for the following:    Glucose, Bld 132 (*)    Albumin 3.4 (*)    Total Bilirubin 1.7 (*)    All other components within normal limits  CBC - Abnormal; Notable for the following:    WBC 13.5 (*)    RBC 4.21 (*)    HCT 38.0 (*)    All other components within normal limits  URINALYSIS, ROUTINE W REFLEX MICROSCOPIC (NOT AT [facility]) - Abnormal; Notable for the following:    Color, Urine ORANGE  (*)    Bilirubin Urine SMALL (*)    Ketones, ur >80 (*)    Protein, ur 100 (*)    All other components within normal limits  URINE MICROSCOPIC-ADD ON - Abnormal; Notable for the following:    Squamous Epithelial / LPF 0-5 (*)    Bacteria, UA RARE (*)    All other components within normal limits  COMPREHENSIVE METABOLIC PANEL - Abnormal; Notable for the following:    Glucose, Bld 132 (*)    Calcium 8.4 (*)    Total Protein 6.2 (*)    Albumin 2.7 (*)    All other components within normal limits  CBC - Abnormal; Notable for the following:    RBC 3.74 (*)    Hemoglobin 11.1 (*)    HCT 33.5 (*)    All other components within normal limits  GLUCOSE, CAPILLARY - Abnormal; Notable for the following:    Glucose-Capillary 105 (*)    All other components within normal limits  GLUCOSE, CAPILLARY - Abnormal; Notable for the following:    Glucose-Capillary 131 (*)    All other components within normal limits  GLUCOSE, CAPILLARY - Abnormal; Notable for the following:    Glucose-Capillary 124 (*)    All other components within normal limits  LIPASE, BLOOD    Imaging Review No results found. I have personally reviewed and evaluated these images and lab results as part of my medical decision-making.   Discussed with general surgery.  Patient to be kept nothing by mouth for possible surgery later today. MDM   Final diagnoses:  Cholecystitis        Nelva Nayobert Margaret Staggs, MD 07/03/15 [phone]

## 2015-07-02 NOTE — ED Notes (Signed)
Report attempted 

## 2015-07-02 NOTE — H&P (Signed)
Austin Kent is an 55 y.o. male.   Chief Complaint: right upper abd pain HPI: 55 yo WM with DM, HTN called our on call surgeon with c/o of ongoing RUQ pain despite pain med and was told to come to ED for admission. Pt initially developed RUQ/side/ upper back pain on 5/3. Went to UC. Workup showed gallstones. Given toradol and zofran and sent out. Went to ED following AM for ongoing pain. Reportedly no signs of infection and was sent home and referred to our office. His appointment isn't until following week. Pain still there, managed at times with pain med given to him by ED. But appetite not good and not feeling well.   No prior symptoms. No wt loss. No jaundice. ocassional NSAID use. No melena/hematochezia.   Past Medical History  Diagnosis Date  . Allergy   . Anxiety   . Hypertension   . Diabetes mellitus without complication [facility])     Past Surgical History  Procedure Laterality Date  . Hernia repair    . Pilonidal cyst / sinus excision    . Nasal sinus surgery      Family History  Problem Relation Age of Onset  . Hypertension Mother   . Anxiety disorder Father   . Anxiety disorder Daughter   . Anxiety disorder Son    Social History:  reports that he has never smoked. He has never used smokeless tobacco. He reports that he does not drink alcohol or use illicit drugs.  Allergies:  Allergies  Allergen Reactions  . Lisinopril Cough  . Amoxicillin Rash    Medications Prior to Admission  Medication Sig Dispense Refill  . citalopram (CELEXA) 20 MG tablet TAKE 1 TABLET BY MOUTH EVERY DAY 30 tablet 0  . glimepiride (AMARYL) 2 MG tablet Take 2 mg by mouth daily with breakfast.    . HYDROcodone-acetaminophen (NORCO/VICODIN) 5-325 MG tablet Take 2 tablets by mouth every 4 (four) hours as needed. (Patient taking differently: Take 2 tablets by mouth every 4 (four) hours as needed for moderate pain. ) 10 tablet 0  . ibuprofen (ADVIL,MOTRIN) 800 MG tablet Take 1 tablet (800 mg  total) by mouth every 8 (eight) hours as needed for moderate pain. 21 tablet 0  . losartan-hydrochlorothiazide (HYZAAR) 50-12.5 MG tablet Take 1 tablet by mouth daily.  10  . metFORMIN (GLUCOPHAGE) 1000 MG tablet Take 1,000 mg by mouth 2 (two) times daily.  4    Results for orders placed or performed during the hospital encounter of 07/02/15 (from the past 48 hour(s))  Lipase, blood     Status: None   Collection Time: 07/02/15 11:58 AM  Result Value Ref Range   Lipase 18 11 - 51 U/L  Comprehensive metabolic panel     Status: Abnormal   Collection Time: 07/02/15 11:58 AM  Result Value Ref Range   Sodium 139 135 - 145 mmol/L   Potassium 3.6 3.5 - 5.1 mmol/L   Chloride 102 101 - 111 mmol/L   CO2 23 22 - 32 mmol/L   Glucose, Bld 132 (H) 65 - 99 mg/dL   BUN 11 6 - 20 mg/dL   Creatinine, Ser 0.82 0.61 - 1.24 mg/dL   Calcium 9.1 8.9 - 10.3 mg/dL   Total Protein 7.0 6.5 - 8.1 g/dL   Albumin 3.4 (L) 3.5 - 5.0 g/dL   AST 22 15 - 41 U/L   ALT 19 17 - 63 U/L   Alkaline Phosphatase 55 38 - 126 U/L  Total Bilirubin 1.7 (H) 0.3 - 1.2 mg/dL   GFR calc non Af Amer >60 >60 mL/min   GFR calc Af Amer >60 >60 mL/min    Comment: (NOTE) The eGFR has been calculated using the CKD EPI equation. This calculation has not been validated in all clinical situations. eGFR's persistently <60 mL/min signify possible Chronic Kidney Disease.    Anion gap 14 5 - 15  CBC     Status: Abnormal   Collection Time: 07/02/15 11:58 AM  Result Value Ref Range   WBC 13.5 (H) 4.0 - 10.5 K/uL   RBC 4.21 (L) 4.22 - 5.81 MIL/uL   Hemoglobin 13.0 13.0 - 17.0 g/dL   HCT 38.0 (L) 39.0 - 52.0 %   MCV 90.3 78.0 - 100.0 fL   MCH 30.9 26.0 - 34.0 pg   MCHC 34.2 30.0 - 36.0 g/dL   RDW 12.9 11.5 - 15.5 %   Platelets 221 150 - 400 K/uL  Urinalysis, Routine w reflex microscopic     Status: Abnormal   Collection Time: 07/02/15 12:01 PM  Result Value Ref Range   Color, Urine ORANGE (A) YELLOW    Comment: BIOCHEMICALS MAY BE  AFFECTED BY COLOR   APPearance CLEAR CLEAR   Specific Gravity, Urine 1.028 1.005 - 1.030   pH 6.0 5.0 - 8.0   Glucose, UA NEGATIVE NEGATIVE mg/dL   Hgb urine dipstick NEGATIVE NEGATIVE   Bilirubin Urine SMALL (A) NEGATIVE   Ketones, ur >80 (A) NEGATIVE mg/dL   Protein, ur 100 (A) NEGATIVE mg/dL   Nitrite NEGATIVE NEGATIVE   Leukocytes, UA NEGATIVE NEGATIVE  Urine microscopic-add on     Status: Abnormal   Collection Time: 07/02/15 12:01 PM  Result Value Ref Range   Squamous Epithelial / LPF 0-5 (A) NONE SEEN   WBC, UA 0-5 0 - 5 WBC/hpf   RBC / HPF 0-5 0 - 5 RBC/hpf   Bacteria, UA RARE (A) NONE SEEN   Urine-Other MUCOUS PRESENT   Glucose, capillary     Status: Abnormal   Collection Time: 07/02/15  8:03 PM  Result Value Ref Range   Glucose-Capillary 105 (H) 65 - 99 mg/dL   No results found.  Review of Systems  Constitutional: Negative for weight loss.  HENT: Negative for nosebleeds.   Eyes: Negative for blurred vision.  Respiratory: Negative for shortness of breath.   Cardiovascular: Negative for chest pain, palpitations, orthopnea and PND.       Denies DOE  Gastrointestinal: Positive for nausea and abdominal pain. Negative for heartburn, diarrhea, constipation and blood in stool.  Genitourinary: Negative for dysuria and hematuria.  Musculoskeletal: Negative.   Skin: Negative for itching and rash.  Neurological: Negative for dizziness, focal weakness, seizures, loss of consciousness and headaches.       Denies TIAs, amaurosis fugax  Endo/Heme/Allergies: Does not bruise/bleed easily.  Psychiatric/Behavioral: The patient is not nervous/anxious.     Blood pressure 114/70, pulse 84, temperature 98.8 F (37.1 C), temperature source Oral, resp. rate 18, height _0  (1.803 m), weight 105.1 kg (231 lb 11.3 oz), SpO2 94 %. Physical Exam  Vitals reviewed. Constitutional: He is oriented to person, place, and time. He appears well-developed and well-nourished. No distress.  Obese,  nontoxic  HENT:  Head: Normocephalic and atraumatic.  Right Ear: External ear normal.  Left Ear: External ear normal.  Eyes: Conjunctivae are normal. No scleral icterus.  Neck: Normal range of motion. Neck supple. No tracheal deviation present. No thyromegaly present.  Cardiovascular:  Normal rate and normal heart sounds.   Respiratory: Effort normal and breath sounds normal. No stridor. No respiratory distress. He has no wheezes.  GI: Soft. He exhibits no distension. There is tenderness in the right upper quadrant. There is positive Murphy's sign. There is no rigidity, no rebound and no guarding.    Musculoskeletal: He exhibits no edema or tenderness.  Lymphadenopathy:    He has no cervical adenopathy.  Neurological: He is alert and oriented to person, place, and time. He exhibits normal muscle tone.  Skin: Skin is warm and dry. No rash noted. He is not diaphoretic. No erythema. No pallor.  Psychiatric: He has a normal mood and affect. His behavior is normal. Judgment and thought content normal.     Assessment/Plan Acute cholecystitis HTN DM Obesity  Admit. Failed outpt mgmt. Moreover with ongoing pain, smoldering wbc and physical exam concerned for cholecystitis.  IV abx Pain control Chemical vte prophylaxis SSI Possible cholecystectomy Sunday, depends on availability Discussed surgery and typical recovery. Dr Donne Hazel to discuss specific risks/complications All questions asked/answered  Leighton Ruff. Redmond Pulling, MD, FACS General, Bariatric, & Minimally Invasive Surgery [facility], [place]   Gayland Curry, MD 07/02/2015, 8:13 PM

## 2015-07-02 NOTE — ED Notes (Signed)
Pt and family reports abd pain, has been for US and was told it was from gallbladder. Was here on Thursday for same and dc home with meds. Reports no relief with meds and still having nausea, not eating. Was told to come in today for surgical consult.

## 2015-07-03 ENCOUNTER — Inpatient Hospital Stay (HOSPITAL_COMMUNITY): Payer: BLUE CROSS/BLUE SHIELD | Admitting: Anesthesiology

## 2015-07-03 ENCOUNTER — Encounter (HOSPITAL_COMMUNITY): Admission: EM | Disposition: A | Discharge status: Home / Self Care | Payer: Self-pay | Source: Home / Self Care

## 2015-07-03 HISTORY — PX: CHOLECYSTECTOMY: SHX55

## 2015-07-03 LAB — GLUCOSE, CAPILLARY
GLUCOSE-CAPILLARY: 124 mg/dL — AB (ref 65–99)
GLUCOSE-CAPILLARY: 125 mg/dL — AB (ref 65–99)
GLUCOSE-CAPILLARY: 131 mg/dL — AB (ref 65–99)
Glucose-Capillary: 160 mg/dL — ABNORMAL HIGH (ref 65–99)
Glucose-Capillary: 169 mg/dL — ABNORMAL HIGH (ref 65–99)
Glucose-Capillary: 253 mg/dL — ABNORMAL HIGH (ref 65–99)

## 2015-07-03 LAB — COMPREHENSIVE METABOLIC PANEL
ALT: 19 U/L (ref 17–63)
AST: 24 U/L (ref 15–41)
Albumin: 2.7 g/dL — ABNORMAL LOW (ref 3.5–5.0)
Alkaline Phosphatase: 52 U/L (ref 38–126)
Anion gap: 11 (ref 5–15)
BUN: 10 mg/dL (ref 6–20)
CHLORIDE: 103 mmol/L (ref 101–111)
CO2: 24 mmol/L (ref 22–32)
Calcium: 8.4 mg/dL — ABNORMAL LOW (ref 8.9–10.3)
Creatinine, Ser: 0.89 mg/dL (ref 0.61–1.24)
GFR calc Af Amer: >60 mL/min (ref >60)
Glucose, Bld: 132 mg/dL — ABNORMAL HIGH (ref 65–99)
POTASSIUM: 3.5 mmol/L (ref 3.5–5.1)
Sodium: 138 mmol/L (ref 135–145)
Total Bilirubin: 1.2 mg/dL (ref 0.3–1.2)
Total Protein: 6.2 g/dL — ABNORMAL LOW (ref 6.5–8.1)

## 2015-07-03 LAB — CBC
HEMATOCRIT: 33.5 % — AB (ref 39.0–52.0)
HEMOGLOBIN: 11.1 g/dL — AB (ref 13.0–17.0)
MCH: 29.7 pg (ref 26.0–34.0)
MCHC: 33.1 g/dL (ref 30.0–36.0)
MCV: 89.6 fL (ref 78.0–100.0)
Platelets: 195 10*3/uL (ref 150–400)
RBC: 3.74 MIL/uL — AB (ref 4.22–5.81)
RDW: 12.8 % (ref 11.5–15.5)
WBC: 9.9 10*3/uL (ref 4.0–10.5)

## 2015-07-03 LAB — SURGICAL PCR SCREEN
MRSA, PCR: NEGATIVE
Staphylococcus aureus: POSITIVE — AB

## 2015-07-03 SURGERY — LAPAROSCOPIC CHOLECYSTECTOMY WITH INTRAOPERATIVE CHOLANGIOGRAM
Anesthesia: General | Site: Abdomen

## 2015-07-03 MED ORDER — CITALOPRAM HYDROBROMIDE 20 MG PO TABS
20.0000 mg | ORAL_TABLET | Freq: Every day | ORAL | Status: DC
  Administered 2015-07-03 – 2015-07-04 (×2): 20 mg via ORAL
  Filled 2015-07-03 (×2): qty 1

## 2015-07-03 MED ORDER — MORPHINE SULFATE (PF) 2 MG/ML IV SOLN
2.0000 mg | INTRAVENOUS | Status: DC | PRN
  Administered 2015-07-03 (×3): 2 mg via INTRAVENOUS
  Filled 2015-07-03 (×3): qty 1

## 2015-07-03 MED ORDER — FENTANYL CITRATE (PF) 250 MCG/5ML IJ SOLN
INTRAMUSCULAR | Status: AC
  Filled 2015-07-03: qty 5

## 2015-07-03 MED ORDER — ROCURONIUM BROMIDE 100 MG/10ML IV SOLN
INTRAVENOUS | Status: DC | PRN
  Administered 2015-07-03 (×3): 25 mg via INTRAVENOUS

## 2015-07-03 MED ORDER — LOSARTAN POTASSIUM 50 MG PO TABS
50.0000 mg | ORAL_TABLET | Freq: Every day | ORAL | Status: DC
  Administered 2015-07-03 – 2015-07-04 (×2): 50 mg via ORAL
  Filled 2015-07-03 (×2): qty 1

## 2015-07-03 MED ORDER — PHENYLEPHRINE 40 MCG/ML (10ML) SYRINGE FOR IV PUSH (FOR BLOOD PRESSURE SUPPORT)
PREFILLED_SYRINGE | INTRAVENOUS | Status: AC
  Filled 2015-07-03: qty 10

## 2015-07-03 MED ORDER — MIDAZOLAM HCL 2 MG/2ML IJ SOLN
INTRAMUSCULAR | Status: AC
  Filled 2015-07-03: qty 2

## 2015-07-03 MED ORDER — ONDANSETRON HCL 4 MG/2ML IJ SOLN
INTRAMUSCULAR | Status: AC
  Filled 2015-07-03: qty 2

## 2015-07-03 MED ORDER — MIDAZOLAM HCL 2 MG/2ML IJ SOLN: 0.5000 mg | Freq: Once | INTRAMUSCULAR | Status: DC | PRN

## 2015-07-03 MED ORDER — ONDANSETRON HCL 4 MG/2ML IJ SOLN
INTRAMUSCULAR | Status: DC | PRN
  Administered 2015-07-03: 4 mg via INTRAVENOUS

## 2015-07-03 MED ORDER — LIDOCAINE HCL (CARDIAC) 20 MG/ML IV SOLN
INTRAVENOUS | Status: DC | PRN
  Administered 2015-07-03: 20 mg via INTRATRACHEAL

## 2015-07-03 MED ORDER — DEXAMETHASONE SODIUM PHOSPHATE 10 MG/ML IJ SOLN
INTRAMUSCULAR | Status: AC
  Filled 2015-07-03: qty 1

## 2015-07-03 MED ORDER — HYDROMORPHONE HCL 1 MG/ML IJ SOLN
INTRAMUSCULAR | Status: AC
  Administered 2015-07-03: 0.5 mg via INTRAVENOUS
  Filled 2015-07-03: qty 1

## 2015-07-03 MED ORDER — LACTATED RINGERS IV SOLN
INTRAVENOUS | Status: DC | PRN
  Administered 2015-07-03 (×3): via INTRAVENOUS

## 2015-07-03 MED ORDER — PROPOFOL 10 MG/ML IV BOLUS
INTRAVENOUS | Status: DC | PRN
  Administered 2015-07-03: 150 mg via INTRAVENOUS

## 2015-07-03 MED ORDER — SODIUM CHLORIDE 0.9 % IR SOLN
Status: DC | PRN
  Administered 2015-07-03: 3000 mL

## 2015-07-03 MED ORDER — HYDROMORPHONE HCL 1 MG/ML IJ SOLN
0.2500 mg | INTRAMUSCULAR | Status: DC | PRN
  Administered 2015-07-03 (×4): 0.5 mg via INTRAVENOUS

## 2015-07-03 MED ORDER — BUPIVACAINE-EPINEPHRINE (PF) 0.5% -1:200000 IJ SOLN
INTRAMUSCULAR | Status: AC
  Filled 2015-07-03: qty 1.8

## 2015-07-03 MED ORDER — BUPIVACAINE HCL (PF) 0.25 % IJ SOLN
INTRAMUSCULAR | Status: AC
  Filled 2015-07-03: qty 30

## 2015-07-03 MED ORDER — MIDAZOLAM HCL 2 MG/2ML IJ SOLN
INTRAMUSCULAR | Status: DC | PRN
  Administered 2015-07-03: 2 mg via INTRAVENOUS

## 2015-07-03 MED ORDER — SCOPOLAMINE 1 MG/3DAYS TD PT72
MEDICATED_PATCH | TRANSDERMAL | Status: AC
  Administered 2015-07-03: 1 via TRANSDERMAL
  Filled 2015-07-03: qty 1

## 2015-07-03 MED ORDER — CEFAZOLIN SODIUM-DEXTROSE 2-4 GM/100ML-% IV SOLN
INTRAVENOUS | Status: AC
  Administered 2015-07-03: 2 g via INTRAVENOUS
  Filled 2015-07-03: qty 100

## 2015-07-03 MED ORDER — ROCURONIUM BROMIDE 50 MG/5ML IV SOLN
INTRAVENOUS | Status: AC
  Filled 2015-07-03: qty 1

## 2015-07-03 MED ORDER — HYDROCODONE-ACETAMINOPHEN 5-325 MG PO TABS
2.0000 | ORAL_TABLET | ORAL | Status: DC | PRN
  Administered 2015-07-03 – 2015-07-05 (×8): 2 via ORAL
  Filled 2015-07-03 (×8): qty 2

## 2015-07-03 MED ORDER — FENTANYL CITRATE (PF) 250 MCG/5ML IJ SOLN
INTRAMUSCULAR | Status: DC | PRN
  Administered 2015-07-03: 150 ug via INTRAVENOUS
  Administered 2015-07-03: 100 ug via INTRAVENOUS
  Administered 2015-07-03: 50 ug via INTRAVENOUS
  Administered 2015-07-03: 100 ug via INTRAVENOUS

## 2015-07-03 MED ORDER — BUPIVACAINE-EPINEPHRINE (PF) 0.5% -1:200000 IJ SOLN
INTRAMUSCULAR | Status: AC
  Filled 2015-07-03: qty 30

## 2015-07-03 MED ORDER — SUCCINYLCHOLINE CHLORIDE 200 MG/10ML IV SOSY
PREFILLED_SYRINGE | INTRAVENOUS | Status: AC
  Filled 2015-07-03: qty 10

## 2015-07-03 MED ORDER — DEXAMETHASONE SODIUM PHOSPHATE 10 MG/ML IJ SOLN
INTRAMUSCULAR | Status: DC | PRN
  Administered 2015-07-03: 10 mg via INTRAVENOUS

## 2015-07-03 MED ORDER — LIDOCAINE 2% (20 MG/ML) 5 ML SYRINGE
INTRAMUSCULAR | Status: AC
  Filled 2015-07-03: qty 5

## 2015-07-03 MED ORDER — HYDROCHLOROTHIAZIDE 12.5 MG PO CAPS
12.5000 mg | ORAL_CAPSULE | Freq: Every day | ORAL | Status: DC
  Administered 2015-07-03 – 2015-07-04 (×2): 12.5 mg via ORAL
  Filled 2015-07-03 (×2): qty 1

## 2015-07-03 MED ORDER — 0.9 % SODIUM CHLORIDE (POUR BTL) OPTIME
TOPICAL | Status: DC | PRN
  Administered 2015-07-03: 1000 mL

## 2015-07-03 MED ORDER — SUCCINYLCHOLINE CHLORIDE 20 MG/ML IJ SOLN
INTRAMUSCULAR | Status: DC | PRN
  Administered 2015-07-03: 120 mg via INTRAVENOUS

## 2015-07-03 MED ORDER — PROPOFOL 10 MG/ML IV BOLUS
INTRAVENOUS | Status: AC
  Filled 2015-07-03: qty 20

## 2015-07-03 MED ORDER — LOSARTAN POTASSIUM-HCTZ 50-12.5 MG PO TABS: 1.0000 | ORAL_TABLET | Freq: Every day | ORAL | Status: DC

## 2015-07-03 MED ORDER — SUGAMMADEX SODIUM 200 MG/2ML IV SOLN
INTRAVENOUS | Status: AC
  Filled 2015-07-03: qty 2

## 2015-07-03 MED ORDER — MEPERIDINE HCL 25 MG/ML IJ SOLN: 6.2500 mg | INTRAMUSCULAR | Status: DC | PRN

## 2015-07-03 MED ORDER — SUGAMMADEX SODIUM 200 MG/2ML IV SOLN
INTRAVENOUS | Status: DC | PRN
  Administered 2015-07-03: 200 mg via INTRAVENOUS

## 2015-07-03 SURGICAL SUPPLY — 43 items
APPLIER CLIP 5 13 M/L LIGAMAX5 (MISCELLANEOUS) ×2
BLADE SURG ROTATE 9660 (MISCELLANEOUS) ×2 IMPLANT
CANISTER SUCTION 2500CC (MISCELLANEOUS) ×2 IMPLANT
CHLORAPREP W/TINT 26ML (MISCELLANEOUS) ×2 IMPLANT
CLIP APPLIE 5 13 M/L LIGAMAX5 (MISCELLANEOUS) ×1 IMPLANT
COVER MAYO STAND STRL (DRAPES) ×2 IMPLANT
COVER SURGICAL LIGHT HANDLE (MISCELLANEOUS) ×2 IMPLANT
DERMABOND ADHESIVE PROPEN (GAUZE/BANDAGES/DRESSINGS) ×1
DERMABOND ADVANCED .7 DNX6 (GAUZE/BANDAGES/DRESSINGS) ×1 IMPLANT
DEVICE TROCAR PUNCTURE CLOSURE (ENDOMECHANICALS) ×2 IMPLANT
DRAIN CHANNEL 19F RND (DRAIN) ×2 IMPLANT
DRAPE C-ARM 42X72 X-RAY (DRAPES) ×2 IMPLANT
ELECT REM PT RETURN 9FT ADLT (ELECTROSURGICAL) ×2
ELECTRODE REM PT RTRN 9FT ADLT (ELECTROSURGICAL) ×1 IMPLANT
EVACUATOR SILICONE 100CC (DRAIN) ×2 IMPLANT
GLOVE BIO SURGEON STRL SZ7 (GLOVE) ×2 IMPLANT
GLOVE BIOGEL PI IND STRL 7.5 (GLOVE) ×1 IMPLANT
GLOVE BIOGEL PI INDICATOR 7.5 (GLOVE) ×1
GOWN STRL REUS W/ TWL LRG LVL3 (GOWN DISPOSABLE) ×3 IMPLANT
GOWN STRL REUS W/TWL LRG LVL3 (GOWN DISPOSABLE) ×3
HEMOSTAT SNOW SURGICEL 2X4 (HEMOSTASIS) ×2 IMPLANT
KIT BASIN OR (CUSTOM PROCEDURE TRAY) ×2 IMPLANT
KIT ROOM TURNOVER OR (KITS) ×2 IMPLANT
LIQUID BAND (GAUZE/BANDAGES/DRESSINGS) ×2 IMPLANT
NS IRRIG 1000ML POUR BTL (IV SOLUTION) ×2 IMPLANT
PAD ARMBOARD 7.5X6 YLW CONV (MISCELLANEOUS) ×2 IMPLANT
POUCH RETRIEVAL ECOSAC 10 (ENDOMECHANICALS) ×1 IMPLANT
POUCH RETRIEVAL ECOSAC 10MM (ENDOMECHANICALS) ×1
SCISSORS LAP 5X35 DISP (ENDOMECHANICALS) ×2 IMPLANT
SET CHOLANGIOGRAPH 5 50 .035 (SET/KITS/TRAYS/PACK) ×2 IMPLANT
SET IRRIG TUBING LAPAROSCOPIC (IRRIGATION / IRRIGATOR) ×2 IMPLANT
SLEEVE ENDOPATH XCEL 5M (ENDOMECHANICALS) ×4 IMPLANT
SPECIMEN JAR SMALL (MISCELLANEOUS) ×2 IMPLANT
STRIP CLOSURE SKIN 1/2X4 (GAUZE/BANDAGES/DRESSINGS) ×2 IMPLANT
SUT ETHILON 2 0 FS 18 (SUTURE) ×2 IMPLANT
SUT MNCRL AB 4-0 PS2 18 (SUTURE) IMPLANT
SUT VICRYL 0 UR6 27IN ABS (SUTURE) ×2 IMPLANT
TOWEL OR 17X24 6PK STRL BLUE (TOWEL DISPOSABLE) ×2 IMPLANT
TOWEL OR 17X26 10 PK STRL BLUE (TOWEL DISPOSABLE) ×2 IMPLANT
TRAY LAPAROSCOPIC MC (CUSTOM PROCEDURE TRAY) ×2 IMPLANT
TROCAR XCEL BLUNT TIP 100MML (ENDOMECHANICALS) ×2 IMPLANT
TROCAR XCEL NON-BLD 5MMX100MML (ENDOMECHANICALS) ×2 IMPLANT
TUBING INSUFFLATION (TUBING) ×2 IMPLANT

## 2015-07-03 NOTE — Op Note (Addendum)
Preoperative diagnosis: acute cholecystitis Postoperative diagnosis: gangrenous cholecystitis Procedure: laparoscopic subtotal cholecystectomy Surgeon: Dr Harden MoMatt Loreda Silverio Anesthesia: general EBL: minimal Drains 19 Fr Blake drain to gb fossa Specimen gb to pathology Complications: none Sponge count correct at completion Disposition to recovery stable  Indications: This is a [age] yom admitted with acute cholecystitis. We discussed lap chole.   Procedure: After informed consent was obtained the patient was taken to the operating room. He was given antibiotics. Sequential compression devices were on his legs. He was placed under general anesthesia without complication. The abdomen was prepped and draped in the standard sterile surgical fashion. A surgical timeout was then performed.  I first infiltrated marcaine below the umbilicus. I grasped the fascia and incised it. I then entered the peritoneum bluntly. There was no entry injury. I placed a 0 vicryl pursestring. I then inserted a hasson trocar and insufflated to 15 mm Hg pressure. I removed some omental adhesions and noted the gallbladder was fused in the triangle.  There was purulence. I was unable to grasp the gallbladder due to the rind.  I aspirated it and made this somewhat easier.  The gallbladder was gangrenous. I had to aspirate it to retract it.  This was very difficult due to inflammation. I was able to identify the cystic artery high on the gallbladder and clip and divide this.  I was unable to safely identify or even enter into the area of the triangle of Calot.  I was concerned about the duodenum and the cbd.  I then took the gallbladder from the top down.  I entered into it and there was purulence in the gallbladder. The back wall would not separate from the liver. At that time I elected to do a subtotal cholecystectomy. I removed as much of the anterior wall that was safe and cauterized the back wall. I removed all the stones with a  stone scooper after sizing the epigastric trocar up to 11 mm.  I then placed snow on the posterior wall and a 19 Fr Blake drain. I secured this with 2-0 nylon. I removed the portion of the gallbladder with an ecosac.  I then removed the umbilical trocar and tied the pursetring down.  I placed an additional 2 0 vicryl sutures at the umbilicus using the endoclose device.  I then removed all trocars. Incisions were closed with 4-0 monocryl, glue and steristrips.  He was extubated and transferred to recovery.  I discussed procedure with his wife especially possible need for ercp

## 2015-07-03 NOTE — Transfer of Care (Signed)
Immediate Anesthesia Transfer of Care Note  Patient: Austin Kent  Procedure(s) Performed: Procedure(s): LAPAROSCOPIC  SUBTOTAL  CHOLECYSTECTOMY  (N/A)  Patient Location: PACU  Anesthesia Type:General  Level of Consciousness: awake  Airway & Oxygen Therapy: Patient Spontanous Breathing and Patient connected to nasal cannula oxygen  Post-op Assessment: Report given to RN and Post -op Vital signs reviewed and stable  Post vital signs: Reviewed and stable  Last Vitals:  Filed Vitals:   07/03/15 0549 07/03/15 0953  BP: 124/69 130/73  Pulse: 79 76  Temp: 37 C 36.8 C  Resp: 17 17    Last Pain:  Filed Vitals:   07/03/15 1440  PainSc: 2          Complications: No apparent anesthesia complications

## 2015-07-03 NOTE — Progress Notes (Signed)
Patient ID: Austin DagoJonathan P Kent, male   DOB: [birth year], 55 y.o.   MRN: [id number] To or today for lap chole. I discussed the procedure in detail. .  We discussed the risks and benefits of a laparoscopic cholecystectomy and possible cholangiogram including, but not limited to bleeding, infection, bile leak, retained gallstones, need to convert to an open procedure, prolonged diarrhea, and possible need for additional procedures.  The likelihood of improvement in symptoms and return to the patient's normal status is good. We discussed the typical post-operative recovery course.

## 2015-07-03 NOTE — Anesthesia Postprocedure Evaluation (Signed)
Anesthesia Post Note  Patient: Austin Kent  Procedure(s) Performed: Procedure(s) (LRB): LAPAROSCOPIC  SUBTOTAL  CHOLECYSTECTOMY  (N/A)  Patient location during evaluation: PACU Anesthesia Type: General Level of consciousness: oriented, patient cooperative and sedated Pain management: pain level controlled Vital Signs Assessment: post-procedure vital signs reviewed and stable Respiratory status: spontaneous breathing, nonlabored ventilation and respiratory function stable Cardiovascular status: blood pressure returned to baseline and stable Postop Assessment: no signs of nausea or vomiting Anesthetic complications: no    Last Vitals:  Filed Vitals:   07/03/15 1538 07/03/15 1617  BP: 154/84 157/81  Pulse: 86 80  Temp:  37.1 C  Resp: 22 20    Last Pain:  Filed Vitals:   07/03/15 1640  PainSc: 8                  Santana Edell,E. Vernelle Wisner

## 2015-07-03 NOTE — Anesthesia Preprocedure Evaluation (Addendum)
Anesthesia Evaluation  Patient identified by MRN, date of birth, ID band Patient awake    Reviewed: Allergy & Precautions, NPO status , Patient's Chart, lab work & pertinent test results  History of Anesthesia Complications Negative for: history of anesthetic complications  Airway Mallampati: II  TM Distance: >3 FB Neck ROM: Full    Dental  (+) Dental Advisory Given   Pulmonary neg pulmonary ROS,    breath sounds clear to auscultation       Cardiovascular hypertension, Pt. on medications (-) angina Rhythm:Regular Rate:Normal     Neuro/Psych negative neurological ROS     GI/Hepatic Neg liver ROS, N/V with acute chole   Endo/Other  diabetes (glu 125), Oral Hypoglycemic AgentsMorbid obesity  Renal/GU negative Renal ROS     Musculoskeletal   Abdominal (+) + obese,   Peds  Hematology negative hematology ROS (+)   Anesthesia Other Findings   Reproductive/Obstetrics                           Anesthesia Physical Anesthesia Plan  ASA: II  Anesthesia Plan: General   Post-op Pain Management:    Induction: Intravenous and Rapid sequence  Airway Management Planned: Oral ETT  Additional Equipment:   Intra-op Plan:   Post-operative Plan: Extubation in OR  Informed Consent: I have reviewed the patients History and Physical, chart, labs and discussed the procedure including the risks, benefits and alternatives for the proposed anesthesia with the patient or authorized representative who has indicated his/her understanding and acceptance.   Dental advisory given  Plan Discussed with: CRNA and Surgeon  Anesthesia Plan Comments: (Plan routine monitors, GETA)        Anesthesia Quick Evaluation

## 2015-07-03 NOTE — Anesthesia Procedure Notes (Addendum)
Procedure Name: Intubation Date/Time: 07/03/2015 12:29 PM Performed by: Alanda AmassFRIEDMAN, Davaughn Hillyard A Pre-anesthesia Checklist: Patient identified, Emergency Drugs available, Suction available, Patient being monitored and Timeout performed Patient Re-evaluated:Patient Re-evaluated prior to inductionOxygen Delivery Method: Circle System Utilized and Circle system utilized Preoxygenation: Pre-oxygenation with 100% oxygen Intubation Type: IV induction, Rapid sequence and Cricoid Pressure applied Laryngoscope Size: Mac and 3 Grade View: Grade III Tube type: Oral Tube size: 7.5 mm Number of attempts: 2 Airway Equipment and Method: Stylet Placement Confirmation: ETT inserted through vocal cords under direct vision,  positive ETCO2 and breath sounds checked- equal and bilateral Secured at: 23 cm Tube secured with: Tape Dental Injury: Teeth and Oropharynx as per pre-operative assessment  Comments: First DL by me, unable to see cords.  Second DL Dr Jean RosenthalJackson, posterior arytenoids seen, ETT placed

## 2015-07-04 ENCOUNTER — Encounter (HOSPITAL_COMMUNITY): Payer: Self-pay | Admitting: General Surgery

## 2015-07-04 LAB — CBC
HCT: 33.2 % — ABNORMAL LOW (ref 39.0–52.0)
Hemoglobin: 11.4 g/dL — ABNORMAL LOW (ref 13.0–17.0)
MCH: 30.6 pg (ref 26.0–34.0)
MCHC: 34.3 g/dL (ref 30.0–36.0)
MCV: 89 fL (ref 78.0–100.0)
PLATELETS: 203 10*3/uL (ref 150–400)
RBC: 3.73 MIL/uL — ABNORMAL LOW (ref 4.22–5.81)
RDW: 12.6 % (ref 11.5–15.5)
WBC: 10.4 10*3/uL (ref 4.0–10.5)

## 2015-07-04 LAB — COMPREHENSIVE METABOLIC PANEL
ALK PHOS: 59 U/L (ref 38–126)
ALT: 26 U/L (ref 17–63)
AST: 25 U/L (ref 15–41)
Albumin: 2.9 g/dL — ABNORMAL LOW (ref 3.5–5.0)
Anion gap: 11 (ref 5–15)
BILIRUBIN TOTAL: 0.9 mg/dL (ref 0.3–1.2)
BUN: 11 mg/dL (ref 6–20)
CHLORIDE: 100 mmol/L — AB (ref 101–111)
CO2: 24 mmol/L (ref 22–32)
CREATININE: 0.77 mg/dL (ref 0.61–1.24)
Calcium: 8.5 mg/dL — ABNORMAL LOW (ref 8.9–10.3)
GFR calc Af Amer: >60 mL/min (ref >60)
Glucose, Bld: 237 mg/dL — ABNORMAL HIGH (ref 65–99)
Potassium: 4 mmol/L (ref 3.5–5.1)
Sodium: 135 mmol/L (ref 135–145)
Total Protein: 6.8 g/dL (ref 6.5–8.1)

## 2015-07-04 LAB — GLUCOSE, CAPILLARY
GLUCOSE-CAPILLARY: 225 mg/dL — AB (ref 65–99)
GLUCOSE-CAPILLARY: 225 mg/dL — AB (ref 65–99)
GLUCOSE-CAPILLARY: 174 mg/dL — AB (ref 65–99)
GLUCOSE-CAPILLARY: 183 mg/dL — AB (ref 65–99)
Glucose-Capillary: 188 mg/dL — ABNORMAL HIGH (ref 65–99)
Glucose-Capillary: 270 mg/dL — ABNORMAL HIGH (ref 65–99)

## 2015-07-04 MED ORDER — ENOXAPARIN SODIUM 40 MG/0.4ML ~~LOC~~ SOLN
40.0000 mg | SUBCUTANEOUS | Status: DC
  Administered 2015-07-04: 40 mg via SUBCUTANEOUS
  Filled 2015-07-04 (×2): qty 0.4

## 2015-07-04 MED ORDER — PANTOPRAZOLE SODIUM 40 MG PO TBEC
40.0000 mg | DELAYED_RELEASE_TABLET | Freq: Every day | ORAL | Status: DC
Start: 2015-07-04 — End: 2015-07-05
  Administered 2015-07-04: 40 mg via ORAL
  Filled 2015-07-04: qty 1

## 2015-07-04 NOTE — Progress Notes (Signed)
Inpatient Diabetes Program Recommendations  AACE/ADA: New Consensus Statement on Inpatient Glycemic Control (2015)  Target Ranges:  Prepandial:   less than 140 mg/dL      Peak postprandial:   less than 180 mg/dL (1-2 hours)      Critically ill patients:  140 - 180 mg/dL  Results for Austin Kent, Austin Kent (MRN [id number]) as of 07/04/2015 14:21  Ref. Range 07/04/2015 04:24 07/04/2015 07:55 07/04/2015 12:07  Glucose-Capillary Latest Ref Range: 65-99 mg/dL 409225 (H) 811188 (H) 914225 (H)   Review of Glycemic Control  Diabetes history: DM Type 2 Outpatient Diabetes medications: Amaryl 2 mg + Metformin 1 gm bid Current orders for Inpatient glycemic control: Novolog correction scale 0-15 units q 4 hrs  Inpatient Diabetes Program Recommendations:  Note patient received Decadron. If blood sugars continue to be > 180, Please consider adding basal insulin Lantus 20 units q hs while oral meds held and A1c to determine prehospitalization glycemic control.  Thank you, Billy FischerJudy E. Analese Sovine, RN, MSN, CDE Inpatient Glycemic Control Team Team Pager [phone] (8am-5pm) 07/04/2015 2:26 PM

## 2015-07-04 NOTE — Care Management Note (Signed)
Case Management Note  Patient Details  Name: Austin DagoJonathan P Kent MRN: [id number] Date of Birth: [birth year]  Subjective/Objective:                    Action/Plan:   Expected Discharge Date:  07/03/15               Expected Discharge Plan:  Home/Self Care  In-House Referral:     Discharge planning Services     Post Acute Care Choice:    Choice offered to:     DME Arranged:    DME Agency:     HH Arranged:    HH Agency:     Status of Service:  In process, will continue to follow  Medicare Important Message Given:    Date Medicare IM Given:    Medicare IM give by:    Date Additional Medicare IM Given:    Additional Medicare Important Message give by:     If discussed at Long Length of Stay Meetings, dates discussed:    Additional Comments:  Kingsley PlanWile, [name] Marie, RN 07/04/2015, 11:19 AM

## 2015-07-04 NOTE — Progress Notes (Signed)
Patient ID: Austin Kent, male   DOB: 11/06/1960, 56 y.o.   MRN: [id number]     [place]      [address] [place], [place] [address]    Phone: [phone] FAX: [phone]     Subjective: Sore. Up in bathroom.  Shoulder pain and ruq pain. 172m drain output, serosanguinous. Voiding. Tolerating fulls. LFTs are normal.  Objective:  Vital signs:  Filed Vitals:   07/03/15 2012 07/04/15 0008 07/04/15 0426 07/04/15 0857  BP: 166/83 138/76 128/76 133/74  Pulse: 89 73 58 67  Temp: 99.8 F (37.7 C) 98.4 F (36.9 C) 97.7 F (36.5 C)   TempSrc: Oral Oral Oral   Resp: _0 Height:      Weight:      SpO2: 93% 95% 95%     Last BM Date: 06/29/15  Intake/Output   Yesterday:  05/07 0701 - 05/08 0700 In: 2880 [P.O.:680; I.V.:2200] Out: 555 [Urine:400; Drains:130; Blood:25] This shift:    I/O last 3 completed shifts: In: 2880 [P.O.:680; I.V.:2200] Out: 544[Urine:400; Drains:130; Blood:25]    Physical Exam: General: Pt awake/alert/oriented x4 in no acute distress Chest: cta.  No chest wall pain w good excursion CV:  Pulses intact.  Regular rhythm Abdomen: Soft.  Nondistended.  JP drain with serosanguinous output. Incisions are c/d/i.  Mildly tender at incisions only.  No evidence of peritonitis.  No incarcerated hernias. Ext:  SCDs BLE.  No mjr edema.  No cyanosis Skin: No petechiae / purpura   Problem List:   Active Problems:   Cholecystitis    Results:   Labs: Results for orders placed or performed during the hospital encounter of 07/02/15 (from the past 48 hour(s))  Lipase, blood     Status: None   Collection Time: 07/02/15 11:58 AM  Result Value Ref Range   Lipase 18 11 - 51 U/L  Comprehensive metabolic panel     Status: Abnormal   Collection Time: 07/02/15 11:58 AM  Result Value Ref Range   Sodium 139 135 - 145 mmol/L   Potassium 3.6 3.5 - 5.1 mmol/L   Chloride 102 101 - 111 mmol/L   CO2 23  22 - 32 mmol/L   Glucose, Bld 132 (H) 65 - 99 mg/dL   BUN 11 6 - 20 mg/dL   Creatinine, Ser 0.82 0.61 - 1.24 mg/dL   Calcium 9.1 8.9 - 10.3 mg/dL   Total Protein 7.0 6.5 - 8.1 g/dL   Albumin 3.4 (L) 3.5 - 5.0 g/dL   AST 22 15 - 41 U/L   ALT 19 17 - 63 U/L   Alkaline Phosphatase 55 38 - 126 U/L   Total Bilirubin 1.7 (H) 0.3 - 1.2 mg/dL   GFR calc non Af Amer >60 >60 mL/min   GFR calc Af Amer >60 >60 mL/min    Comment: (NOTE) The eGFR has been calculated using the CKD EPI equation. This calculation has not been validated in all clinical situations. eGFR's persistently <60 mL/min signify possible Chronic Kidney Disease.    Anion gap 14 5 - 15  CBC     Status: Abnormal   Collection Time: 07/02/15 11:58 AM  Result Value Ref Range   WBC 13.5 (H) 4.0 - 10.5 K/uL   RBC 4.21 (L) 4.22 - 5.81 MIL/uL   Hemoglobin 13.0 13.0 - 17.0 g/dL   HCT 38.0 (L) 39.0 - 52.0 %   MCV 90.3 78.0 - 100.0 fL  MCH 30.9 26.0 - 34.0 pg   MCHC 34.2 30.0 - 36.0 g/dL   RDW 12.9 11.5 - 15.5 %   Platelets 221 150 - 400 K/uL  Urinalysis, Routine w reflex microscopic     Status: Abnormal   Collection Time: 07/02/15 12:01 PM  Result Value Ref Range   Color, Urine ORANGE (A) YELLOW    Comment: BIOCHEMICALS MAY BE AFFECTED BY COLOR   APPearance CLEAR CLEAR   Specific Gravity, Urine 1.028 1.005 - 1.030   pH 6.0 5.0 - 8.0   Glucose, UA NEGATIVE NEGATIVE mg/dL   Hgb urine dipstick NEGATIVE NEGATIVE   Bilirubin Urine SMALL (A) NEGATIVE   Ketones, ur >80 (A) NEGATIVE mg/dL   Protein, ur 100 (A) NEGATIVE mg/dL   Nitrite NEGATIVE NEGATIVE   Leukocytes, UA NEGATIVE NEGATIVE  Urine microscopic-add on     Status: Abnormal   Collection Time: 07/02/15 12:01 PM  Result Value Ref Range   Squamous Epithelial / LPF 0-5 (A) NONE SEEN   WBC, UA 0-5 0 - 5 WBC/hpf   RBC / HPF 0-5 0 - 5 RBC/hpf   Bacteria, UA RARE (A) NONE SEEN   Urine-Other MUCOUS PRESENT   Glucose, capillary     Status: Abnormal   Collection Time:  07/02/15  8:03 PM  Result Value Ref Range   Glucose-Capillary 105 (H) 65 - 99 mg/dL  Surgical pcr screen     Status: Abnormal   Collection Time: 07/02/15 10:19 PM  Result Value Ref Range   MRSA, PCR NEGATIVE NEGATIVE   Staphylococcus aureus POSITIVE (A) NEGATIVE    Comment:        The Xpert SA Assay (FDA approved for NASAL specimens in patients over 36 years of age), is one component of a comprehensive surveillance program.  Test performance has been validated by [facility] for patients greater than or equal to 89 year old. It is not intended to diagnose infection nor to guide or monitor treatment.   Glucose, capillary     Status: Abnormal   Collection Time: 07/03/15 12:11 AM  Result Value Ref Range   Glucose-Capillary 131 (H) 65 - 99 mg/dL  Glucose, capillary     Status: Abnormal   Collection Time: 07/03/15  4:01 AM  Result Value Ref Range   Glucose-Capillary 124 (H) 65 - 99 mg/dL  Comprehensive metabolic panel     Status: Abnormal   Collection Time: 07/03/15  4:24 AM  Result Value Ref Range   Sodium 138 135 - 145 mmol/L   Potassium 3.5 3.5 - 5.1 mmol/L   Chloride 103 101 - 111 mmol/L   CO2 24 22 - 32 mmol/L   Glucose, Bld 132 (H) 65 - 99 mg/dL   BUN 10 6 - 20 mg/dL   Creatinine, Ser 0.89 0.61 - 1.24 mg/dL   Calcium 8.4 (L) 8.9 - 10.3 mg/dL   Total Protein 6.2 (L) 6.5 - 8.1 g/dL   Albumin 2.7 (L) 3.5 - 5.0 g/dL   AST 24 15 - 41 U/L   ALT 19 17 - 63 U/L   Alkaline Phosphatase 52 38 - 126 U/L   Total Bilirubin 1.2 0.3 - 1.2 mg/dL   GFR calc non Af Amer >60 >60 mL/min   GFR calc Af Amer >60 >60 mL/min    Comment: (NOTE) The eGFR has been calculated using the CKD EPI equation. This calculation has not been validated in all clinical situations. eGFR's persistently <60 mL/min signify possible Chronic Kidney Disease.  Anion gap 11 5 - 15  CBC     Status: Abnormal   Collection Time: 07/03/15  4:24 AM  Result Value Ref Range   WBC 9.9 4.0 - 10.5 K/uL   RBC 3.74  (L) 4.22 - 5.81 MIL/uL   Hemoglobin 11.1 (L) 13.0 - 17.0 g/dL   HCT 33.5 (L) 39.0 - 52.0 %   MCV 89.6 78.0 - 100.0 fL   MCH 29.7 26.0 - 34.0 pg   MCHC 33.1 30.0 - 36.0 g/dL   RDW 12.8 11.5 - 15.5 %   Platelets 195 150 - 400 K/uL  Glucose, capillary     Status: Abnormal   Collection Time: 07/03/15  7:51 AM  Result Value Ref Range   Glucose-Capillary 125 (H) 65 - 99 mg/dL   Comment 1 Notify RN   Glucose, capillary     Status: Abnormal   Collection Time: 07/03/15  2:41 PM  Result Value Ref Range   Glucose-Capillary 160 (H) 65 - 99 mg/dL   Comment 1 Notify RN   Glucose, capillary     Status: Abnormal   Collection Time: 07/03/15  4:22 PM  Result Value Ref Range   Glucose-Capillary 169 (H) 65 - 99 mg/dL   Comment 1 Notify RN   Glucose, capillary     Status: Abnormal   Collection Time: 07/03/15  8:10 PM  Result Value Ref Range   Glucose-Capillary 253 (H) 65 - 99 mg/dL  Glucose, capillary     Status: Abnormal   Collection Time: 07/04/15 12:02 AM  Result Value Ref Range   Glucose-Capillary 270 (H) 65 - 99 mg/dL   Comment 1 Notify RN   Comprehensive metabolic panel     Status: Abnormal   Collection Time: 07/04/15  3:48 AM  Result Value Ref Range   Sodium 135 135 - 145 mmol/L   Potassium 4.0 3.5 - 5.1 mmol/L   Chloride 100 (L) 101 - 111 mmol/L   CO2 24 22 - 32 mmol/L   Glucose, Bld 237 (H) 65 - 99 mg/dL   BUN 11 6 - 20 mg/dL   Creatinine, Ser 0.77 0.61 - 1.24 mg/dL   Calcium 8.5 (L) 8.9 - 10.3 mg/dL   Total Protein 6.8 6.5 - 8.1 g/dL   Albumin 2.9 (L) 3.5 - 5.0 g/dL   AST 25 15 - 41 U/L   ALT 26 17 - 63 U/L   Alkaline Phosphatase 59 38 - 126 U/L   Total Bilirubin 0.9 0.3 - 1.2 mg/dL   GFR calc non Af Amer >60 >60 mL/min   GFR calc Af Amer >60 >60 mL/min    Comment: (NOTE) The eGFR has been calculated using the CKD EPI equation. This calculation has not been validated in all clinical situations. eGFR's persistently <60 mL/min signify possible Chronic Kidney Disease.     Anion gap 11 5 - 15  CBC     Status: Abnormal   Collection Time: 07/04/15  3:48 AM  Result Value Ref Range   WBC 10.4 4.0 - 10.5 K/uL   RBC 3.73 (L) 4.22 - 5.81 MIL/uL   Hemoglobin 11.4 (L) 13.0 - 17.0 g/dL   HCT 33.2 (L) 39.0 - 52.0 %   MCV 89.0 78.0 - 100.0 fL   MCH 30.6 26.0 - 34.0 pg   MCHC 34.3 30.0 - 36.0 g/dL   RDW 12.6 11.5 - 15.5 %   Platelets 203 150 - 400 K/uL  Glucose, capillary     Status: Abnormal   Collection  Time: 07/04/15  4:24 AM  Result Value Ref Range   Glucose-Capillary 225 (H) 65 - 99 mg/dL  Glucose, capillary     Status: Abnormal   Collection Time: 07/04/15  7:55 AM  Result Value Ref Range   Glucose-Capillary 188 (H) 65 - 99 mg/dL    Imaging / Studies: No results found.  Medications / Allergies:  Scheduled Meds: . cefTRIAXone (ROCEPHIN)  IV  2 g Intravenous Q24H  . citalopram  20 mg Oral Daily  . losartan  50 mg Oral Daily   And  . hydrochlorothiazide  12.5 mg Oral Daily  . insulin aspart  0-15 Units Subcutaneous Q4H  . pantoprazole (PROTONIX) IV  40 mg Intravenous QHS   Continuous Infusions:  PRN Meds:.diphenhydrAMINE **OR** diphenhydrAMINE, HYDROcodone-acetaminophen, morphine injection, ondansetron **OR** ondansetron (ZOFRAN) IV, simethicone, zolpidem  Antibiotics: Anti-infectives    Start     Dose/Rate Route Frequency Ordered Stop   07/03/15 1600  cefTRIAXone (ROCEPHIN) 2 g in dextrose 5 % 50 mL IVPB     2 g 100 mL/hr over 30 Minutes Intravenous Every 24 hours 07/02/15 2138     07/03/15 1100  ceFAZolin (ANCEF) 2-4 GM/100ML-% IVPB    Comments:  Marinda Elk   : cabinet override      07/03/15 1100 07/03/15 1231   07/02/15 1545  cefTRIAXone (ROCEPHIN) 2 g in dextrose 5 % 50 mL IVPB     2 g 100 mL/hr over 30 Minutes Intravenous  Once 07/02/15 1540 07/02/15 1615        Assessment/Plan Gangrenous cholecystitis Laparoscopic subtotal cholecystectomy---Dr. Donne Hazel -continue with drain, high risk for developing a bile leak -AM  labs -advance diet, mobilize, IS, pain control ID-rocephin FEN-sliv VTE prophylaxis-scd, add lovenox HTN-home meds  Dispo-continue inpatient, watch for bile leak  Erby Pian, ANP-BC [facility] Pager [phone](7A-4:30P) For consults and floor pages call [phone](7A-4:30P)  07/04/2015 9:25 AM

## 2015-07-05 LAB — COMPREHENSIVE METABOLIC PANEL
ALT: 29 U/L (ref 17–63)
ANION GAP: 13 (ref 5–15)
AST: 33 U/L (ref 15–41)
Albumin: 2.9 g/dL — ABNORMAL LOW (ref 3.5–5.0)
Alkaline Phosphatase: 58 U/L (ref 38–126)
BUN: 16 mg/dL (ref 6–20)
CHLORIDE: 96 mmol/L — AB (ref 101–111)
CO2: 28 mmol/L (ref 22–32)
Calcium: 8.6 mg/dL — ABNORMAL LOW (ref 8.9–10.3)
Creatinine, Ser: 0.75 mg/dL (ref 0.61–1.24)
Glucose, Bld: 131 mg/dL — ABNORMAL HIGH (ref 65–99)
POTASSIUM: 4 mmol/L (ref 3.5–5.1)
Sodium: 137 mmol/L (ref 135–145)
Total Bilirubin: 1.1 mg/dL (ref 0.3–1.2)
Total Protein: 6.8 g/dL (ref 6.5–8.1)

## 2015-07-05 LAB — GLUCOSE, CAPILLARY
GLUCOSE-CAPILLARY: 118 mg/dL — AB (ref 65–99)
GLUCOSE-CAPILLARY: 128 mg/dL — AB (ref 65–99)

## 2015-07-05 MED ORDER — HYDROCODONE-ACETAMINOPHEN 5-325 MG PO TABS: 1.0000 | ORAL_TABLET | Freq: Four times a day (QID) | ORAL | Status: DC | PRN

## 2015-07-05 MED ORDER — INSULIN ASPART 100 UNIT/ML ~~LOC~~ SOLN: 0.0000 [IU] | Freq: Three times a day (TID) | SUBCUTANEOUS | Status: DC

## 2015-07-05 NOTE — Discharge Summary (Signed)
Physician Discharge Summary  Austin Kent NGE:[id number] DOB: March 01, 1960 DOA: 07/02/2015  PCP: Pcp Not In System  Consultation:  none  Admit date: 07/02/2015 Discharge date: 07/05/2015  Recommendations for Outpatient Follow-up:   Follow-up Information    Follow up with [facility] On 07/13/2015.   Specialty:  General Surgery   Why:  arrive by 9:45AM for a 10:15AM drain check   Contact information:   [address] [place] [address] [phone]       [facility] weeks.   Specialty:  General Surgery   Why:  our office will call you with a follow up for this appointment    Contact information:   [address] [place] [address] [phone]      Discharge Diagnoses:  1. Gangrenous cholecystitis    Surgical Procedure: Laparoscopic subtotal cholecystectomy---Dr. Dwain SarnaWakefield  Discharge Condition: stable Disposition: home  Diet recommendation: regular  Filed Weights   07/02/15 1150 07/02/15 1730  Weight: 104.463 kg (230 lb 4.8 oz) 105.1 kg (231 lb 11.3 oz)     Filed Vitals:   07/04/15 2003 07/05/15 0527  BP: 140/79 145/80  Pulse: 67 62  Temp: 98.7 F (37.1 C) 98.2 F (36.8 C)  Resp: 17 18     Hospital Course:  [facility] Naegeli[facility] abdominal [facility].  He was previously seen at urgent care and the ED, referred to our office, but had persistent pain.  He was found to have cholecystitis and admitted for surgery.  He tolerated the procedure well, but found to have a gangrenous gallbladder and had a subtotal cholecystectomy and a drain placed.  On POD#1 he was noted for have serosanguinous output, LFTS were normal.  On POD#2 he was noted to have serous drainage with 70ml recorded over the last 24 hours with normal LFTs.  He was therefore felt stable for discharge home with the drain and a follow up in 1 week.  He was instructed on drain care and recording output.  He  was instructed on warning signs that warrant further evaluation.  He was encouraged to call with questions or concerns.   Physical Exam: General: Pt awake/alert/oriented x4 in no acute distress Chest: cta. No chest wall pain w good excursion CV: Pulses intact. Regular rhythm Abdomen: Soft. Nondistended. JP drain with serous output. Incisions are c/d/i. Mildly tender at incisions only. No evidence of peritonitis. No incarcerated hernias. Ext: SCDs BLE. No mjr edema. No cyanosis Skin: No petechiae / purpura  Discharge Instructions     Medication List    TAKE these medications        citalopram 20 MG tablet  Commonly known as:  CELEXA  TAKE 1 TABLET BY MOUTH EVERY DAY     glimepiride 2 MG tablet  Commonly known as:  AMARYL  Take 2 mg by mouth daily with breakfast.     HYDROcodone-acetaminophen 5-325 MG tablet  Commonly known as:  NORCO/VICODIN  Take 1-2 tablets by mouth every 6 (six) hours as needed.     ibuprofen 800 MG tablet  Commonly known as:  ADVIL,MOTRIN  Take 1 tablet (800 mg total) by mouth every 8 (eight) hours as needed for moderate pain.     losartan-hydrochlorothiazide 50-12.5 MG tablet  Commonly known as:  HYZAAR  Take 1 tablet by mouth daily.     metFORMIN 1000 MG tablet  Commonly known as:  GLUCOPHAGE  Take 1,000 mg by  mouth 2 (two) times daily.           Follow-up Information    Follow up with [facility], MD In 1 week.   Specialty:  General Surgery   Contact information:   [address] [place] [address] [phone]        The results of significant diagnostics from this hospitalization (including imaging, microbiology, ancillary and laboratory) are listed below for reference.    Significant Diagnostic Studies: US Abdomen Limited Ruq  Jun 30, 2015  CLINICAL DATA:  Sudden onset of right upper quadrant pain this morning. Nausea and vomiting. EXAM: US ABDOMEN LIMITED - RIGHT UPPER QUADRANT COMPARISON:  None.  FINDINGS: Gallbladder: Multiple calculi within the gallbladder lumen. The largest measures 7 mm. No gallbladder wall thickening or pericholecystic fluid. Common bile duct: Diameter: 3.1 mm.  Not optimally visible. Liver: There is generalized echogenicity of hepatic parenchyma without focal lesion. This may represent fatty infiltration. Incidental findings include an upper pole right collecting system renal calculus measuring 3 x 4 mm. IMPRESSION: Cholelithiasis.  Right nephrolithiasis. Electronically Signed   By: Ellery Plunk M.D.   On: 06-30-2015 18:18    Microbiology: Recent Results (from the past 240 hour(s))  Surgical pcr screen     Status: Abnormal   Collection Time: 07/02/15 10:19 PM  Result Value Ref Range Status   MRSA, PCR NEGATIVE NEGATIVE Final   Staphylococcus aureus POSITIVE (A) NEGATIVE Final    Comment:        The Xpert SA Assay (FDA approved for NASAL specimens in patients over 27 years of age), is one component of a comprehensive surveillance program.  Test performance has been validated by [facility] for patients greater than or equal to 29 year old. It is not intended to diagnose infection nor to guide or monitor treatment.      Labs: Basic Metabolic Panel:  Recent Labs Lab 06/30/15 0915 07/02/15 1158 07/03/15 0424 07/04/15 0348 07/05/15 0503  NA 139 139 138 135 137  K 3.9 3.6 3.5 4.0 4.0  CL 105 102 103 100* 96*  CO2 25 23 24 24 28   GLUCOSE 159* 132* 132* 237* 131*  BUN 17 11 10 11 16   CREATININE 0.89 0.82 0.89 0.77 0.75  CALCIUM 9.3 9.1 8.4* 8.5* 8.6*   Liver Function Tests:  Recent Labs Lab 06/30/15 0915 07/02/15 1158 07/03/15 0424 07/04/15 0348 07/05/15 0503  AST 21 22 24 25  33  ALT 29 19 19 26 29   ALKPHOS 38 55 52 59 58  BILITOT 1.4* 1.7* 1.2 0.9 1.1  PROT 6.8 7.0 6.2* 6.8 6.8  ALBUMIN 4.0 3.4* 2.7* 2.9* 2.9*    Recent Labs Lab 06/30/15 0915 07/02/15 1158  LIPASE 33 18   No results for input(s): AMMONIA in the last 168  hours. CBC:  Recent Labs Lab Jun 30, 2015 1521 06/30/15 0915 07/02/15 1158 07/03/15 0424 07/04/15 0348  WBC 12.4* 15.2* 13.5* 9.9 10.4  NEUTROABS  --  13.0*  --   --   --   HGB 15.3 14.2 13.0 11.1* 11.4*  HCT 42.1* 41.6 38.0* 33.5* 33.2*  MCV 88.4 91.6 90.3 89.6 89.0  PLT  --  181 221 195 203   Cardiac Enzymes: No results for input(s): CKTOTAL, CKMB, CKMBINDEX, TROPONINI in the last 168 hours. BNP: BNP (last 3 results) No results for input(s): BNP in the last 8760 hours.  ProBNP (last 3 results) No results for input(s): PROBNP in the last 8760 hours.  CBG:  Recent Labs Lab  07/04/15 1207 07/04/15 1636 07/04/15 2001 07/05/15 0007 07/05/15 0814  GLUCAP 225* 174* 183* 128* 118*    Active Problems:   Cholecystitis   Time coordinating discharge: <30 mins   Signed:  Taisley Mordan, ANP-BC

## 2015-07-05 NOTE — Progress Notes (Signed)
Discussed discharge summary with patient. Reviewed all medications with patient. Patient received Rx. Patient educated on JP drain use at home and sent with supplies. Patient ready for discharge.

## 2015-07-05 NOTE — Discharge Instructions (Signed)
LAPAROSCOPIC SURGERY: POST OP INSTRUCTIONS ° °1. DIET: Follow a light bland diet the first 24 hours after arrival home, such as soup, liquids, crackers, etc.  Be sure to include lots of fluids daily.  Avoid fast food or heavy meals as your are more likely to get nauseated.  Eat a low fat the next few days after surgery.   °2. Take your usually prescribed home medications unless otherwise directed. °3. PAIN CONTROL: °a. Pain is best controlled by a usual combination of three different methods TOGETHER: °i. Ice/Heat °ii. Over the counter pain medication °iii. Prescription pain medication °b. Most patients will experience some swelling and bruising around the incisions.  Ice packs or heating pads (30-60 minutes up to 6 times a day) will help. Use ice for the first few days to help decrease swelling and bruising, then switch to heat to help relax tight/sore spots and speed recovery.  Some people prefer to use ice alone, heat alone, alternating between ice & heat.  Experiment to what works for you.  Swelling and bruising can take several weeks to resolve.   °c. It is helpful to take an over-the-counter pain medication regularly for the first few weeks.  Choose one of the following that works best for you: °i. Naproxen (Aleve, etc)  Two 220mg tabs twice a day °ii. Ibuprofen (Advil, etc) Three 200mg tabs four times a day (every meal & bedtime) °iii. Acetaminophen (Tylenol, etc) 500-650mg four times a day (every meal & bedtime) °d. A  prescription for pain medication (such as oxycodone, hydrocodone, etc) should be given to you upon discharge.  Take your pain medication as prescribed.  °i. If you are having problems/concerns with the prescription medicine (does not control pain, nausea, vomiting, rash, itching, etc), please call us [phone] to see if we need to switch you to a different pain medicine that will work better for you and/or control your side effect better. °ii. If you need a refill on your pain medication,  please contact your pharmacy.  They will contact our office to request authorization. Prescriptions will not be filled after 5 pm or on week-ends. °4. Avoid getting constipated.  Between the surgery and the pain medications, it is common to experience some constipation.  Increasing fluid intake and taking a fiber supplement (such as Metamucil, Citrucel, FiberCon, MiraLax, etc) 1-2 times a day regularly will usually help prevent this problem from occurring.  A mild laxative (prune juice, Milk of Magnesia, MiraLax, etc) should be taken according to package directions if there are no bowel movements after 48 hours.   °5. Watch out for diarrhea.  If you have many loose bowel movements, simplify your diet to bland foods & liquids for a few days.  Stop any stool softeners and decrease your fiber supplement.  Switching to mild anti-diarrheal medications (Kayopectate, Pepto Bismol) can help.  If this worsens or does not improve, please call us. °6. Wash / shower every day.  You may shower over the dressings as they are waterproof.  Continue to shower over incision(s) after the dressing is off. °7. Remove your waterproof bandages 5 days after surgery.  You may leave the incision open to air.  You may replace a dressing/Band-Aid to cover the incision for comfort if you wish.  °8. ACTIVITIES as tolerated:   °a. You may resume regular (light) daily activities beginning the next day--such as daily self-care, walking, climbing stairs--gradually increasing activities as tolerated.  If you can walk 30 minutes without difficulty, it   is safe to try more intense activity such as jogging, treadmill, bicycling, low-impact aerobics, swimming, etc. °b. Save the most intensive and strenuous activity for last such as sit-ups, heavy lifting, contact sports, etc  Refrain from any heavy lifting or straining until you are off narcotics for pain control.   °c. DO NOT PUSH THROUGH PAIN.  Let pain be your guide: If it hurts to do something, don't  do it.  Pain is your body warning you to avoid that activity for another week until the pain goes down. °d. You may drive when you are no longer taking prescription pain medication, you can comfortably wear a seatbelt, and you can safely maneuver your car and apply brakes. °e. You may have sexual intercourse when it is comfortable.  °9. FOLLOW UP in our office °a. Please call [facility] at [phone] to set up an appointment to see your surgeon in the office for a follow-up appointment approximately 2-3 weeks after your surgery. °b. Make sure that you call for this appointment the day you arrive home to insure a convenient appointment time. °10. IF YOU HAVE DISABILITY OR FAMILY LEAVE FORMS, BRING THEM TO THE OFFICE FOR PROCESSING.  DO NOT GIVE THEM TO YOUR DOCTOR. ° ° °WHEN TO CALL US [phone]: °1. Poor pain control °2. Reactions / problems with new medications (rash/itching, nausea, etc)  °3. Fever over 101.5 F (38.5 C) °4. Inability to urinate °5. Nausea and/or vomiting °6. Worsening swelling or bruising °7. Continued bleeding from incision. °8. Increased pain, redness, or drainage from the incision ° ° The clinic staff is available to answer your questions during regular business hours (8:30am-5pm).  Please don’t hesitate to call and ask to speak to one of our nurses for clinical concerns.  ° If you have a medical emergency, go to the nearest emergency room or call 911. ° A surgeon from [facility] is always on call at the hospitals ° ° °[facility] °[address], [address], [place], [place]  [address] ? °MAIN: [phone] ? TOLL FREE: [phone] ?  °FAX [phone] °[email or web address] ° °DRAIN CARE:   °You have a closed bulb drain to help you heal. ° °A bulb drain is a small, plastic reservoir which creates a gentle suction. It is used to remove excess fluid from a surgical wound. The color and amount of fluid will vary. Immediately after surgery, the  fluid is bright red. It may gradually change to a yellow color. When the amount decreases to about 1 or 2 tablespoons (15 to 30 cc) per 24 hours, your caregiver will usually remove it. ° °DAILY CARE °· Keep the bulb compressed at all times, except while emptying it. The compression creates suction.  °· Keep sites where the tubes enter the skin dry and covered with a light bandage (dressing).  °· Tape the tubes to your skin, 1 to 2 inches below the insertion sites, to keep from pulling on your stitches. Tubes are stitched in place and will not slip out.  °· Pin the bulb to your shirt (not to your pants) with a safety pin.  °· For the first few days after surgery, there usually is more fluid in the bulb. Empty the bulb whenever it becomes half full because the bulb does not create enough suction if it is too full. Include this amount in your 24 hour totals.  °· When the amount of drainage decreases, empty the bulb at the same time every   day. Write down the amounts and the 24 hour totals. Your caregiver will want to know them. This helps your caregiver know when the tubes can be removed.  °· (We anticipate removing the drain in 1-3 weeks, depending on when the output is <30mL a day for 2+ days) °· If there is drainage around the tube sites, change dressings and keep the area dry. If you see a clot in the tube, leave it alone. However, if the tube does not appear to be draining, let your caregiver know.  °TO EMPTY THE BULB °· Open the stopper to release suction.  °· Holding the stopper out of the way, pour drainage into the measuring cup that was sent home with you.  °· Measure and write down the amount. If there are 2 bulbs, note the amount of drainage from bulb 1 or bulb 2 and keep the totals separate. Your caregiver will want to know which tube is draining more.  °· Compress the bulb by folding it in half.  °· Replace the stopper.  °· Check the tape that holds the tube to your skin, and pin the bulb to your shirt.    °SEEK MEDICAL CARE IF: °· The drainage develops a bad odor.  °· You have an oral temperature above 102° F (38.9° C).  °· The amount of drainage from your wound suddenly increases or decreases.  °· You accidentally pull out your drain.  °· You have any other questions or concerns.  °MAKE SURE YOU:  °· Understand these instructions.  °· Will watch your condition.  °· Will get help right away if you are not doing well or get worse.  ° ° °· Call our office if you have any questions about your drain. [phone] °·  °

## 2015-09-28 DIAGNOSIS — K648 Other hemorrhoids: Secondary | ICD-10-CM | POA: Diagnosis not present

## 2015-09-28 DIAGNOSIS — Z86010 Personal history of colonic polyps: Secondary | ICD-10-CM | POA: Diagnosis not present

## 2015-09-30 ENCOUNTER — Encounter (HOSPITAL_COMMUNITY): Payer: Self-pay | Admitting: *Deleted

## 2015-09-30 ENCOUNTER — Other Ambulatory Visit: Payer: Self-pay | Admitting: Physician Assistant

## 2015-09-30 ENCOUNTER — Other Ambulatory Visit (HOSPITAL_BASED_OUTPATIENT_CLINIC_OR_DEPARTMENT_OTHER): Payer: BLUE CROSS/BLUE SHIELD

## 2015-09-30 ENCOUNTER — Emergency Department (HOSPITAL_COMMUNITY)
Admission: EM | Admit: 2015-09-30 | Discharge: 2015-09-30 | Disposition: A | Discharge status: Home / Self Care | Payer: BLUE CROSS/BLUE SHIELD | Attending: Emergency Medicine | Admitting: Emergency Medicine

## 2015-09-30 ENCOUNTER — Ambulatory Visit (INDEPENDENT_AMBULATORY_CARE_PROVIDER_SITE_OTHER): Payer: BLUE CROSS/BLUE SHIELD | Admitting: Physician Assistant

## 2015-09-30 VITALS — BP 122/72 | HR 96 | Temp 98.7°F | Resp 17 | Ht 71.5 in | Wt 235.0 lb

## 2015-09-30 DIAGNOSIS — I10 Essential (primary) hypertension: Secondary | ICD-10-CM | POA: Diagnosis not present

## 2015-09-30 DIAGNOSIS — Z7984 Long term (current) use of oral hypoglycemic drugs: Secondary | ICD-10-CM | POA: Diagnosis not present

## 2015-09-30 DIAGNOSIS — M79605 Pain in left leg: Secondary | ICD-10-CM | POA: Diagnosis not present

## 2015-09-30 DIAGNOSIS — M79662 Pain in left lower leg: Secondary | ICD-10-CM | POA: Diagnosis not present

## 2015-09-30 DIAGNOSIS — E119 Type 2 diabetes mellitus without complications: Secondary | ICD-10-CM | POA: Insufficient documentation

## 2015-09-30 LAB — D-DIMER, QUANTITATIVE (NOT AT ARMC): D DIMER QUANT: 1.05 ug{FEU}/mL — AB (ref <0.50)

## 2015-09-30 MED ORDER — ENOXAPARIN SODIUM 120 MG/0.8ML ~~LOC~~ SOLN
1.0000 mg/kg | Freq: Once | SUBCUTANEOUS | Status: AC
  Administered 2015-09-30: 105 mg via SUBCUTANEOUS
  Filled 2015-09-30: qty 0.8

## 2015-09-30 NOTE — Progress Notes (Signed)
   09/30/2015 3:22 PM   DOB: [birth year] / MRN: [id number]  SUBJECTIVE:  Austin Kent is a 55 y.o. male presenting for left sided posterior calf pain that started about 7 days ago.  He denies any trauma to the area. He reports he did go on a long plane ride from [place] to  about 4 weeks ago. He denies leg swelling, chest pain, cough, SOB, DOE.   He is allergic to lisinopril and amoxicillin.   He  has a past medical history of Allergy; Anxiety; Diabetes mellitus without complication (HCC); and Hypertension.    He  reports that he has never smoked. He has never used smokeless tobacco. He reports that he does not drink alcohol or use drugs. He  has no sexual activity history on file. The patient  has a past surgical history that includes Hernia repair; Pilonidal cyst / sinus excision; Nasal sinus surgery; and Cholecystectomy (N/A, 07/03/2015).  His family history includes Anxiety disorder in his daughter, father, and son; Hypertension in his mother.  Review of Systems  Constitutional: Negative for chills and fever.  Respiratory: Negative for cough.   Cardiovascular: Negative for chest pain.  Genitourinary: Negative for dysuria.  Musculoskeletal: Positive for myalgias. Negative for joint pain.  Skin: Negative for itching and rash.  Neurological: Negative for dizziness and headaches.    The problem list and medications were reviewed and updated by myself where necessary and exist elsewhere in the encounter.   OBJECTIVE:  BP 122/72 (BP Location: Right Arm, Patient Position: Sitting, Cuff Size: Large)   Pulse 96   Temp 98.7 F (37.1 C) (Oral)   Resp 17   Ht 5' 11.5" (1.816 m)   Wt 235 lb (106.6 kg)   SpO2 97%   BMI 32.32 kg/m   Physical Exam  Constitutional: He is oriented to person, place, and time. He appears well-developed.  Cardiovascular: Normal rate, regular rhythm and normal heart sounds.   Pulmonary/Chest: Effort normal and breath sounds normal.  Musculoskeletal:  Normal range of motion.       Left lower leg: He exhibits no swelling.       Legs:      Left foot: There is no tenderness.  Neurological: He is alert and oriented to person, place, and time.  Skin: Skin is warm and dry.  Psychiatric: He has a normal mood and affect.    No results found for this or any previous visit (from the past 72 hour(s)).  No results found.  ASSESSMENT AND PLAN  Austin Kent was seen today for leg pain.  Diagnoses and all orders for this visit:  Pain of left calf: His exam is reassuring. D-dimer will likely come back negative and this is an MSK etiology. Advised that if positive he will need to proceed to the nearest ED for a scan and possible treatment.  -     D-dimer, quantitative (not at [facility])    The patient is advised to call or return to clinic if he does not see an improvement in symptoms, or to seek the care of the closest emergency department if he worsens with the above plan.   Deliah Boston, MHS, PA-C [facility] 09/30/2015 3:22 PM

## 2015-09-30 NOTE — Patient Instructions (Signed)
     IF you received an x-ray today, you will receive an invoice from [facility]. Please contact [facility] at [phone] with questions or concerns regarding your invoice.   IF you received labwork today, you will receive an invoice from [facility]. Please contact [facility] at [phone] with questions or concerns regarding your invoice.   Our billing staff will not be able to assist you with questions regarding bills from these companies.  You will be contacted with the lab results as soon as they are available. The fastest way to get your results is to activate your My Chart account. Instructions are located on the last page of this paperwork. If you have not heard from us regarding the results in 2 weeks, please contact this office.      

## 2015-09-30 NOTE — ED Provider Notes (Signed)
[facility]-EMERGENCY DEPT Provider Note   CSN: [id number] Arrival date & time: 09/30/15  1910  First Provider Contact:   First MD Initiated Contact with Patient 09/30/15 2023     By signing my name below, I, Emmanuella Mensah, attest that this documentation has been prepared under the direction and in the presence of Cheri Fowler, PA-C. Electronically Signed: Angelene Giovanni, ED Scribe. 09/30/15. 8:35 PM.    History   Chief Complaint Chief Complaint  Patient presents with  . Leg Pain    HPI Comments: Austin Kent is a 55 y.o. male with a hx of DM and hypertension who presents to the Emergency Department complaining of ongoing mild left posterior calf pain onset one week ago. He notes that the pain is only noticeable when he stretches his lower leg. No alleviating factors noted. Pt has not tried any medications for his symptoms. He denies any recent falls, injuries, or trauma. He states that he was on a long flight from [place] to [place] one month ago and has been driving long distances after that. He reports a hx of cholecystectomy May 2017. He denies any recent immobilization or that he is a current smoker. No hx of PE/DVT He denies any anti-coagulant use, only daily aspirin. Pt was seen at [facility] on [address] today and had a positive D-dimer and advised to come to the ED for evaluation for DVT. He denies any chest pain or SOB.    The history is provided by the patient. No language interpreter was used.    Past Medical History:  Diagnosis Date  . Allergy   . Anxiety   . Diabetes mellitus without complication (HCC)   . Hypertension     Patient Active Problem List   Diagnosis Date Noted  . Cholecystitis 07/02/2015  . Nephrolithiasis 06/29/2015  . Cholelithiasis 06/29/2015  . Hypertension 03/18/2012    Past Surgical History:  Procedure Laterality Date  . CHOLECYSTECTOMY N/A 07/03/2015   Procedure: LAPAROSCOPIC  SUBTOTAL  CHOLECYSTECTOMY ;  Surgeon: Emelia Loron, MD;  Location: [facility]  OR;  Service: General;  Laterality: N/A;  . HERNIA REPAIR    . NASAL SINUS SURGERY    . PILONIDAL CYST / SINUS EXCISION         Home Medications    Prior to Admission medications   Medication Sig Start Date End Date Taking? Authorizing Provider  citalopram (CELEXA) 20 MG tablet TAKE 1 TABLET BY MOUTH EVERY DAY 10/18/12   Morrell Riddle, PA-C  glimepiride (AMARYL) 2 MG tablet Take 2 mg by mouth daily with breakfast.    Historical Provider, MD  losartan-hydrochlorothiazide (HYZAAR) 50-12.5 MG tablet Take 1 tablet by mouth daily. 06/27/15   Historical Provider, MD  metFORMIN (GLUCOPHAGE) 1000 MG tablet Take 1,000 mg by mouth 2 (two) times daily. 06/27/15   Historical Provider, MD    Family History Family History  Problem Relation Age of Onset  . Hypertension Mother   . Anxiety disorder Father   . Anxiety disorder Daughter   . Anxiety disorder Son     Social History Social History  Substance Use Topics  . Smoking status: Never Smoker  . Smokeless tobacco: Never Used  . Alcohol use No     Allergies   Hctz [hydrochlorothiazide]; Lisinopril; and Amoxicillin   Review of Systems Review of Systems  Respiratory: Negative for shortness of breath.   Cardiovascular: Negative for chest pain.  Musculoskeletal: Positive for myalgias.  Allergic/Immunologic: Negative for immunocompromised state.     Physical Exam  Updated Vital Signs BP 132/82 (BP Location: Left Arm)   Pulse 84   Temp 99.2 F (37.3 C) (Oral)   Resp 14   Ht 5\' 11"  (1.803 m)   Wt 106.3 kg   SpO2 98%   BMI 32.67 kg/m   Physical Exam  Constitutional: He is oriented to person, place, and time. He appears well-developed and well-nourished.  HENT:  Head: Normocephalic and atraumatic.  Right Ear: External ear normal.  Left Ear: External ear normal.  Eyes: Conjunctivae are normal. No scleral icterus.  Neck: No tracheal deviation present.  Cardiovascular: Normal rate, regular rhythm and normal heart sounds.     Pulses:      Dorsalis pedis pulses are 2+ on the right side, and 2+ on the left side.  No unilateral leg swelling.   Pulmonary/Chest: Effort normal and breath sounds normal. No respiratory distress.  Abdominal: Soft. He exhibits no distension.  Musculoskeletal: Normal range of motion.  Pain with active and passive dorsiflexion on the right.   Neurological: He is alert and oriented to person, place, and time.  Skin: Skin is warm and dry.  Minimal tenderness along posterior calf without erythema, warmth, swelling, or palpable cords.   Psychiatric: He has a normal mood and affect. His behavior is normal.     ED Treatments / Results  DIAGNOSTIC STUDIES: Oxygen Saturation is 98% on RA, normal by my interpretation.    COORDINATION OF CARE: 8:33 PM- Pt advised of plan for treatment and pt agrees. Pt will return tomorrow am for outpatient [place] for further evaluation. Will consult attending for appropriate lab work.     Labs (all labs ordered are listed, but only abnormal results are displayed) Labs Reviewed - No data to display  EKG  EKG Interpretation None       Radiology No results found.  Procedures Procedures (including critical care time)  Medications Ordered in ED Medications  enoxaparin (LOVENOX) injection 105 mg (105 mg Subcutaneous Given 09/30/15 2059)     Initial Impression / Assessment and Plan / ED Course  Cheri Fowler, PA-C has reviewed the triage vital signs and the nursing notes.  Pertinent labs & imaging results that were available during my care of the patient were reviewed by me and considered in my medical decision making (see chart for details).  Clinical Course   History presents with left posterior calf pain 1 week. Good pulses. Doubt arterial occlusion. Does not appear infectious. VSS, NAD. He denies any chest pain or shortness of breath or other signs of PE at this time. He was seen at urgent care and found to have a positive d-dimer at 1.05. Ultrasound  is not available at this time. Patient given therapeutic dose of Lovenox and lower sternal ultrasound scheduled for tomorrow. Return precautions discussed. Patient agrees and immunologist. The above plan for discharge.   Final Clinical Impressions(s) / ED Diagnoses   Final diagnoses:  Left leg pain   I personally performed the services described in this documentation, which was scribed in my presence. The recorded information has been reviewed and is accurate.   New Prescriptions New Prescriptions   No medications on file     Gwinda Maine 09/30/15 2129    Mancel Bale, MD 10/01/15 [phone]

## 2015-09-30 NOTE — ED Notes (Signed)
Patient able to ambulate independently  

## 2015-09-30 NOTE — Addendum Note (Signed)
Addended by: Thelma Barge D on: 09/30/2015 06:27 PM   Modules accepted: Orders

## 2015-09-30 NOTE — Progress Notes (Signed)
I have called the patient several times and advised he go to the ED. I was able to speak with his son and advised him of the risk and he reports to me that he will try to contact his father and have his Dad go straight to the ED for a scan of the affected leg and treatment. We had tried to arrange the scan for the patient however he did not call us back before the office closed so we had to change the plan and advise the ED. Deliah Boston, MS, PA-C 6:43 PM, 09/30/2015

## 2015-09-30 NOTE — ED Triage Notes (Signed)
Pt c/o left lower leg pain. Pt was seen at [facility] on [address] had D-dimer drawn, called pt stating d-dimer was twice the normal range, instructed to come here for r/o blood clot. Left calf is not red or swollen, pt only reports pain when he flexes his toes.

## 2015-10-01 ENCOUNTER — Ambulatory Visit (HOSPITAL_BASED_OUTPATIENT_CLINIC_OR_DEPARTMENT_OTHER)
Admission: RE | Admit: 2015-10-01 | Discharge: 2015-10-01 | Disposition: A | Discharge status: Home / Self Care | Payer: BLUE CROSS/BLUE SHIELD | Source: Ambulatory Visit | Attending: Emergency Medicine | Admitting: Emergency Medicine

## 2015-10-01 ENCOUNTER — Encounter (HOSPITAL_COMMUNITY): Payer: Self-pay | Admitting: Emergency Medicine

## 2015-10-01 ENCOUNTER — Emergency Department (HOSPITAL_COMMUNITY)
Admission: EM | Admit: 2015-10-01 | Discharge: 2015-10-01 | Disposition: A | Discharge status: Home / Self Care | Payer: BLUE CROSS/BLUE SHIELD | Attending: Emergency Medicine | Admitting: Emergency Medicine

## 2015-10-01 DIAGNOSIS — I82402 Acute embolism and thrombosis of unspecified deep veins of left lower extremity: Secondary | ICD-10-CM

## 2015-10-01 DIAGNOSIS — M79662 Pain in left lower leg: Secondary | ICD-10-CM | POA: Insufficient documentation

## 2015-10-01 DIAGNOSIS — Z7984 Long term (current) use of oral hypoglycemic drugs: Secondary | ICD-10-CM | POA: Insufficient documentation

## 2015-10-01 DIAGNOSIS — E119 Type 2 diabetes mellitus without complications: Secondary | ICD-10-CM | POA: Diagnosis not present

## 2015-10-01 DIAGNOSIS — Z7901 Long term (current) use of anticoagulants: Secondary | ICD-10-CM | POA: Diagnosis not present

## 2015-10-01 DIAGNOSIS — Z79899 Other long term (current) drug therapy: Secondary | ICD-10-CM | POA: Diagnosis not present

## 2015-10-01 DIAGNOSIS — I10 Essential (primary) hypertension: Secondary | ICD-10-CM | POA: Insufficient documentation

## 2015-10-01 DIAGNOSIS — M79609 Pain in unspecified limb: Secondary | ICD-10-CM | POA: Diagnosis not present

## 2015-10-01 DIAGNOSIS — I824Z2 Acute embolism and thrombosis of unspecified deep veins of left distal lower extremity: Secondary | ICD-10-CM | POA: Insufficient documentation

## 2015-10-01 MED ORDER — RIVAROXABAN (XARELTO) VTE STARTER PACK (15 & 20 MG): ORAL_TABLET | ORAL | 0 refills | Status: DC

## 2015-10-01 MED ORDER — RIVAROXABAN (XARELTO) EDUCATION KIT FOR DVT/PE PATIENTS
PACK | Freq: Once | Status: AC
Start: 2015-10-01 — End: 2015-10-01
  Administered 2015-10-01: 11:00:00
  Filled 2015-10-01: qty 1

## 2015-10-01 NOTE — ED Notes (Signed)
Pharmacist spoke w/pt.

## 2015-10-01 NOTE — ED Notes (Signed)
Received Xarelto kit. Pharmacy Tech to request pharmacist to consult w/pt.

## 2015-10-01 NOTE — ED Notes (Signed)
Sitting on stretcher reading book. Aware of delay.

## 2015-10-01 NOTE — ED Provider Notes (Signed)
[facility] DEPT Provider Note   CSN: [id number] Arrival date & time: 10/01/15  6122  By signing my name below, I, [name], attest that this documentation has been prepared under the direction and in the presence of [facility], PA-C. Electronically Signed: [name], ED Scribe. 10/01/15. 9:58 AM.  First Provider Contact:   First MD Initiated Contact with Patient 10/01/15 [phone]       History   Chief Complaint Chief Complaint  Patient presents with  . DVT   The history is provided by the patient. No language interpreter was used.   HPI Comments: Austin Kent is a 55 y.o. Male with a hx of HTN and DM who presents to the Emergency Department after testing positive for DVT on ultrasound. Pt was seen last night in the ED after having a positive D-Dimer at Urgent Care. He was placed on a therapeutic dose of Lovenox last night. He came in this morning for an ultrasound, which was positive for DVT and sent to the ED for further evaluation. Pt is complaining of pain in the left calf that worsens with dorsiflexion/extension. He reports relief with rest. Pt had a cholecystectomy in May and a colonoscopy a few days ago. He has also had long distance travel in the past couple of weeks, flying to [place]. Pt takes baby aspirin daily. Pt has a follow up appointment with his PCP at [facility] in two weeks.  He denies fever, chest pain, SOB, or palpitations.   Past Medical History:  Diagnosis Date  . Allergy   . Anxiety   . Diabetes mellitus without complication ([place])   . Hypertension     Patient Active Problem List   Diagnosis Date Noted  . Cholecystitis 07/02/2015  . Nephrolithiasis 06/29/2015  . Cholelithiasis 06/29/2015  . Hypertension 03/18/2012    Past Surgical History:  Procedure Laterality Date  . CHOLECYSTECTOMY N/A 07/03/2015   Procedure: LAPAROSCOPIC  SUBTOTAL  CHOLECYSTECTOMY ;  Surgeon: Rolm Bookbinder, MD;  Location: [facility];  Service: General;   Laterality: N/A;  . HERNIA REPAIR    . NASAL SINUS SURGERY    . PILONIDAL CYST / SINUS EXCISION         Home Medications    Prior to Admission medications   Medication Sig Start Date End Date Taking? Authorizing Provider  citalopram (CELEXA) 20 MG tablet TAKE 1 TABLET BY MOUTH EVERY DAY 10/18/12   Mancel Bale, PA-C  glimepiride (AMARYL) 2 MG tablet Take 2 mg by mouth daily with breakfast.    Historical Provider, MD  losartan-hydrochlorothiazide (HYZAAR) 50-12.5 MG tablet Take 1 tablet by mouth daily. 06/27/15   Historical Provider, MD  metFORMIN (GLUCOPHAGE) 1000 MG tablet Take 1,000 mg by mouth 2 (two) times daily. 06/27/15   Historical Provider, MD  Rivaroxaban 15 & 20 MG TBPK Take as directed on package: Start with one 76m tablet by mouth twice a day with food. On Day 22, switch to one 229mtablet once a day with food. 10/01/15   JaSonoitaPA-C    Family History Family History  Problem Relation Age of Onset  . Hypertension Mother   . Anxiety disorder Father   . Anxiety disorder Daughter   . Anxiety disorder Son     Social History Social History  Substance Use Topics  . Smoking status: Never Smoker  . Smokeless tobacco: Never Used  . Alcohol use No     Allergies   Hctz [hydrochlorothiazide]; Lisinopril; and Amoxicillin  Review of Systems Review of Systems  Constitutional: Negative for fever.  Respiratory: Negative for shortness of breath.   Cardiovascular: Negative for chest pain, palpitations and leg swelling.  Musculoskeletal: Positive for arthralgias.  All other systems reviewed and are negative.    Physical Exam Updated Vital Signs BP 140/85 (BP Location: Right Arm)   Pulse 67   Temp 98.4 F (36.9 C) (Oral)   Resp 14   SpO2 97%   Physical Exam  Constitutional: He is oriented to person, place, and time. He appears well-developed and well-nourished. No distress.  HENT:  Head: Normocephalic and atraumatic.  Cardiovascular: Normal rate,  regular rhythm and normal heart sounds.  Exam reveals no gallop and no friction rub.   No murmur heard. 2+ DP pulses bilaterally.   Pulmonary/Chest: Effort normal and breath sounds normal. No respiratory distress. He has no wheezes. He has no rales. He exhibits no tenderness.  Abdominal: Soft. He exhibits no distension. There is no tenderness.  Musculoskeletal:  Minimal tenderness along posterior calf without erythema, warmth, swelling, or palpable cords. No unilateral swelling appreciated.   Neurological: He is alert and oriented to person, place, and time.  Skin: Skin is warm and dry.  Nursing note and vitals reviewed.  ED Treatments / Results  DIAGNOSTIC STUDIES: Oxygen Saturation is 97% on RA, normal by my interpretation.    COORDINATION OF CARE: 9:56 AM-Discussed treatment plan which includes Xarelto and follow up with PCP with pt at bedside and pt agreed to plan.    Labs (all labs ordered are listed, but only abnormal results are displayed) Labs Reviewed - No data to display  EKG  EKG Interpretation None       Radiology No results found.  Procedures Procedures (including critical care time)  Medications Ordered in ED Medications  rivaroxaban Alveda Reasons) Education Kit for DVT/PE patients (not administered)     Initial Impression / Assessment and Plan / ED Course  I have reviewed the triage vital signs and the nursing notes.  Pertinent labs & imaging results that were available during my care of the patient were reviewed by me and considered in my medical decision making (see chart for details).  Clinical Course   Austin Kent presents to ED for left calf pain and ultrasound positive for acute DVT. No chest pain or shortness of breath. Patient has not tachycardic or hypoxic. No bleeding risk factors. Patient has a primary care physician at [facility] and has an appointment in 2 weeks. Will start on Xarelto - rx given. Pharmacy discussed medication with patient  and provided educational kit. Patient takes baby ASA daily - will dc given starting on Xarelto. PCP follow up stressed. Reasons to return to ER discussed and all questions answered.   Patient discussed with Dr. Johnney Killian who agrees with treatment plan.   Final Clinical Impressions(s) / ED Diagnoses   Final diagnoses:  Acute DVT (deep venous thrombosis), left  I personally performed the services described in this documentation, which was scribed in my presence. The recorded information has been reviewed and is accurate.    New Prescriptions New Prescriptions   RIVAROXABAN 15 & 20 MG TBPK    Take as directed on package: Start with one 75m tablet by mouth twice a day with food. On Day 22, switch to one 290mtablet once a day with food.     JaEastern Niagara Hospitalard, PA-C 10/01/15 1053    MaCharlesetta ShanksMD 10/02/15 [phone]

## 2015-10-01 NOTE — ED Triage Notes (Signed)
Pt seen here last night after being sent here from urgent care for positive d-dimer-- came in for ultrasound -- positive for DVT-- sent here. Left leg

## 2015-10-01 NOTE — Progress Notes (Signed)
VASCULAR LAB PRELIMINARY  PRELIMINARY  PRELIMINARY  PRELIMINARY  Left lower extremity venous duplex completed.    Preliminary report:  There is acute DVT noted in the left posterior tibial vein.    Lamondre Wesche, RVT 10/01/2015, 8:49 AM

## 2015-10-01 NOTE — Discharge Instructions (Signed)
Keep your scheduled appointment with your primary care physician. Take your medication as directed - this is very important! Return to ER for chest pain, difficulty breathing, new or worsening symptoms, any additional concerns.   Information on my medicine - XARELTO (rivaroxaban)  This medication education was reviewed with me or my healthcare representative as part of my discharge preparation.  The pharmacist that spoke with me during my hospital stay was:  Armandina Stammer, [facility]  WHY WAS XARELTO PRESCRIBED FOR YOU? Xarelto was prescribed to treat blood clots that may have been found in the veins of your legs (deep vein thrombosis) or in your lungs (pulmonary embolism) and to reduce the risk of them occurring again.  What do you need to know about Xarelto? The starting dose is one 15 mg tablet taken TWICE daily with food for the FIRST 21 DAYS then on (enter date)  8/26  the dose is changed to one 20 mg tablet taken ONCE A DAY with your evening meal.  DO NOT stop taking Xarelto without talking to the health care provider who prescribed the medication.  Refill your prescription for 20 mg tablets before you run out.  After discharge, you should have regular check-up appointments with your healthcare provider that is prescribing your Xarelto.  In the future your dose may need to be changed if your kidney function changes by a significant amount.  What do you do if you miss a dose? If you are taking Xarelto TWICE DAILY and you miss a dose, take it as soon as you remember. You may take two 15 mg tablets (total 30 mg) at the same time then resume your regularly scheduled 15 mg twice daily the next day.  If you are taking Xarelto ONCE DAILY and you miss a dose, take it as soon as you remember on the same day then continue your regularly scheduled once daily regimen the next day. Do not take two doses of Xarelto at the same time.   Important Safety Information Xarelto is a blood thinner  medicine that can cause bleeding. You should call your healthcare provider right away if you experience any of the following: ? Bleeding from an injury or your nose that does not stop. ? Unusual colored urine (red or dark brown) or unusual colored stools (red or black). ? Unusual bruising for unknown reasons. ? A serious fall or if you hit your head (even if there is no bleeding).  Some medicines may interact with Xarelto and might increase your risk of bleeding while on Xarelto. To help avoid this, consult your healthcare provider or pharmacist prior to using any new prescription or non-prescription medications, including herbals, vitamins, non-steroidal anti-inflammatory drugs (NSAIDs) and supplements.  This website has more information on Xarelto: VisitDestination.com.br.

## 2015-10-04 DIAGNOSIS — E119 Type 2 diabetes mellitus without complications: Secondary | ICD-10-CM | POA: Diagnosis not present

## 2015-10-11 DIAGNOSIS — E119 Type 2 diabetes mellitus without complications: Secondary | ICD-10-CM | POA: Diagnosis not present

## 2015-10-11 DIAGNOSIS — I82402 Acute embolism and thrombosis of unspecified deep veins of left lower extremity: Secondary | ICD-10-CM | POA: Diagnosis not present

## 2015-10-11 DIAGNOSIS — I10 Essential (primary) hypertension: Secondary | ICD-10-CM | POA: Diagnosis not present

## 2015-10-11 DIAGNOSIS — E1165 Type 2 diabetes mellitus with hyperglycemia: Secondary | ICD-10-CM | POA: Diagnosis not present

## 2016-01-11 DIAGNOSIS — I10 Essential (primary) hypertension: Secondary | ICD-10-CM | POA: Diagnosis not present

## 2016-01-11 DIAGNOSIS — Z86718 Personal history of other venous thrombosis and embolism: Secondary | ICD-10-CM | POA: Diagnosis not present

## 2016-01-11 DIAGNOSIS — E119 Type 2 diabetes mellitus without complications: Secondary | ICD-10-CM | POA: Diagnosis not present

## 2016-01-11 DIAGNOSIS — Z7984 Long term (current) use of oral hypoglycemic drugs: Secondary | ICD-10-CM | POA: Diagnosis not present

## 2016-01-11 DIAGNOSIS — F419 Anxiety disorder, unspecified: Secondary | ICD-10-CM | POA: Diagnosis not present

## 2016-04-20 DIAGNOSIS — E6609 Other obesity due to excess calories: Secondary | ICD-10-CM | POA: Diagnosis not present

## 2016-04-20 DIAGNOSIS — E119 Type 2 diabetes mellitus without complications: Secondary | ICD-10-CM | POA: Diagnosis not present

## 2016-04-20 DIAGNOSIS — I10 Essential (primary) hypertension: Secondary | ICD-10-CM | POA: Diagnosis not present

## 2016-04-20 DIAGNOSIS — Z0000 Encounter for general adult medical examination without abnormal findings: Secondary | ICD-10-CM | POA: Diagnosis not present

## 2016-10-09 DIAGNOSIS — E119 Type 2 diabetes mellitus without complications: Secondary | ICD-10-CM | POA: Diagnosis not present

## 2016-10-09 DIAGNOSIS — H524 Presbyopia: Secondary | ICD-10-CM | POA: Diagnosis not present

## 2016-10-09 DIAGNOSIS — R740 Nonspecific elevation of levels of transaminase and lactic acid dehydrogenase [LDH]: Secondary | ICD-10-CM | POA: Diagnosis not present

## 2016-10-09 DIAGNOSIS — I10 Essential (primary) hypertension: Secondary | ICD-10-CM | POA: Diagnosis not present

## 2016-10-09 DIAGNOSIS — F419 Anxiety disorder, unspecified: Secondary | ICD-10-CM | POA: Diagnosis not present

## 2016-10-09 DIAGNOSIS — H31001 Unspecified chorioretinal scars, right eye: Secondary | ICD-10-CM | POA: Diagnosis not present

## 2016-10-09 DIAGNOSIS — H52203 Unspecified astigmatism, bilateral: Secondary | ICD-10-CM | POA: Diagnosis not present

## 2016-10-09 DIAGNOSIS — H5212 Myopia, left eye: Secondary | ICD-10-CM | POA: Diagnosis not present

## 2017-05-22 DIAGNOSIS — Z7984 Long term (current) use of oral hypoglycemic drugs: Secondary | ICD-10-CM | POA: Diagnosis not present

## 2017-05-22 DIAGNOSIS — Z125 Encounter for screening for malignant neoplasm of prostate: Secondary | ICD-10-CM | POA: Diagnosis not present

## 2017-05-22 DIAGNOSIS — Z13220 Encounter for screening for lipoid disorders: Secondary | ICD-10-CM | POA: Diagnosis not present

## 2017-05-22 DIAGNOSIS — I10 Essential (primary) hypertension: Secondary | ICD-10-CM | POA: Diagnosis not present

## 2017-05-22 DIAGNOSIS — E6609 Other obesity due to excess calories: Secondary | ICD-10-CM | POA: Diagnosis not present

## 2017-05-22 DIAGNOSIS — E119 Type 2 diabetes mellitus without complications: Secondary | ICD-10-CM | POA: Diagnosis not present

## 2017-05-22 DIAGNOSIS — Z0000 Encounter for general adult medical examination without abnormal findings: Secondary | ICD-10-CM | POA: Diagnosis not present

## 2017-08-22 DIAGNOSIS — E119 Type 2 diabetes mellitus without complications: Secondary | ICD-10-CM | POA: Diagnosis not present

## 2017-08-22 DIAGNOSIS — I10 Essential (primary) hypertension: Secondary | ICD-10-CM | POA: Diagnosis not present

## 2017-10-15 DIAGNOSIS — H52203 Unspecified astigmatism, bilateral: Secondary | ICD-10-CM | POA: Diagnosis not present

## 2017-10-15 DIAGNOSIS — E119 Type 2 diabetes mellitus without complications: Secondary | ICD-10-CM | POA: Diagnosis not present

## 2017-10-15 DIAGNOSIS — H524 Presbyopia: Secondary | ICD-10-CM | POA: Diagnosis not present

## 2017-12-09 ENCOUNTER — Emergency Department (HOSPITAL_COMMUNITY): Payer: BLUE CROSS/BLUE SHIELD

## 2017-12-09 ENCOUNTER — Encounter (HOSPITAL_COMMUNITY): Payer: Self-pay | Admitting: *Deleted

## 2017-12-09 ENCOUNTER — Ambulatory Visit (INDEPENDENT_AMBULATORY_CARE_PROVIDER_SITE_OTHER): Payer: BLUE CROSS/BLUE SHIELD

## 2017-12-09 ENCOUNTER — Ambulatory Visit: Payer: BLUE CROSS/BLUE SHIELD | Admitting: Emergency Medicine

## 2017-12-09 ENCOUNTER — Inpatient Hospital Stay (HOSPITAL_COMMUNITY)
Admission: EM | Admit: 2017-12-09 | Discharge: 2017-12-12 | DRG: 419 | Disposition: A | Discharge status: Home / Self Care | Payer: BLUE CROSS/BLUE SHIELD | Attending: General Surgery | Admitting: General Surgery

## 2017-12-09 ENCOUNTER — Other Ambulatory Visit: Payer: Self-pay

## 2017-12-09 ENCOUNTER — Encounter: Payer: Self-pay | Admitting: Emergency Medicine

## 2017-12-09 VITALS — BP 130/64 | HR 61 | Temp 98.1°F | Resp 16 | Ht 71.0 in | Wt 227.2 lb

## 2017-12-09 DIAGNOSIS — K8000 Calculus of gallbladder with acute cholecystitis without obstruction: Secondary | ICD-10-CM | POA: Diagnosis not present

## 2017-12-09 DIAGNOSIS — E1165 Type 2 diabetes mellitus with hyperglycemia: Secondary | ICD-10-CM

## 2017-12-09 DIAGNOSIS — K5641 Fecal impaction: Secondary | ICD-10-CM | POA: Diagnosis not present

## 2017-12-09 DIAGNOSIS — K8010 Calculus of gallbladder with chronic cholecystitis without obstruction: Secondary | ICD-10-CM | POA: Diagnosis not present

## 2017-12-09 DIAGNOSIS — R1011 Right upper quadrant pain: Secondary | ICD-10-CM

## 2017-12-09 DIAGNOSIS — Z8249 Family history of ischemic heart disease and other diseases of the circulatory system: Secondary | ICD-10-CM | POA: Diagnosis not present

## 2017-12-09 DIAGNOSIS — I10 Essential (primary) hypertension: Secondary | ICD-10-CM | POA: Diagnosis present

## 2017-12-09 DIAGNOSIS — K819 Cholecystitis, unspecified: Secondary | ICD-10-CM | POA: Diagnosis present

## 2017-12-09 DIAGNOSIS — K82A1 Gangrene of gallbladder in cholecystitis: Secondary | ICD-10-CM | POA: Diagnosis not present

## 2017-12-09 DIAGNOSIS — K810 Acute cholecystitis: Secondary | ICD-10-CM | POA: Diagnosis not present

## 2017-12-09 DIAGNOSIS — E119 Type 2 diabetes mellitus without complications: Secondary | ICD-10-CM | POA: Diagnosis not present

## 2017-12-09 LAB — CBC
HEMATOCRIT: 41.9 % (ref 39.0–52.0)
HEMOGLOBIN: 14.3 g/dL (ref 13.0–17.0)
MCH: 31.1 pg (ref 26.0–34.0)
MCHC: 34.1 g/dL (ref 30.0–36.0)
MCV: 91.1 fL (ref 80.0–100.0)
NRBC: 0 % (ref 0.0–0.2)
Platelets: 219 10*3/uL (ref 150–400)
RBC: 4.6 MIL/uL (ref 4.22–5.81)
RDW: 12.8 % (ref 11.5–15.5)
WBC: 14.2 10*3/uL — AB (ref 4.0–10.5)

## 2017-12-09 LAB — COMPREHENSIVE METABOLIC PANEL
ALT: 24 U/L (ref 0–44)
AST: 26 U/L (ref 15–41)
Albumin: 4.4 g/dL (ref 3.5–5.0)
Alkaline Phosphatase: 36 U/L — ABNORMAL LOW (ref 38–126)
Anion gap: 8 (ref 5–15)
BILIRUBIN TOTAL: 0.9 mg/dL (ref 0.3–1.2)
BUN: 22 mg/dL — ABNORMAL HIGH (ref 6–20)
CHLORIDE: 109 mmol/L (ref 98–111)
CO2: 24 mmol/L (ref 22–32)
Calcium: 9.3 mg/dL (ref 8.9–10.3)
Creatinine, Ser: 1.2 mg/dL (ref 0.61–1.24)
GFR calc Af Amer: >60 mL/min (ref >60)
Glucose, Bld: 123 mg/dL — ABNORMAL HIGH (ref 70–99)
POTASSIUM: 3.7 mmol/L (ref 3.5–5.1)
Sodium: 141 mmol/L (ref 135–145)
TOTAL PROTEIN: 7.2 g/dL (ref 6.5–8.1)

## 2017-12-09 LAB — I-STAT CG4 LACTIC ACID, ED: LACTIC ACID, VENOUS: 1.59 mmol/L (ref 0.5–1.9)

## 2017-12-09 LAB — POCT CBC
GRANULOCYTE PERCENT: 89.4 % — AB (ref 37–80)
HCT, POC: 46.2 % (ref 43.5–53.7)
HEMOGLOBIN: 15.5 g/dL (ref 14.1–18.1)
Lymph, poc: 0.7 (ref 0.6–3.4)
MCH: 31.4 pg — AB (ref 27–31.2)
MCHC: 33.7 g/dL (ref 31.8–35.4)
MCV: 93.3 fL (ref 80–97)
MID (cbc): 0.4 (ref 0–0.9)
MPV: 6.5 fL (ref 0–99.8)
PLATELET COUNT, POC: 282 10*3/uL (ref 142–424)
POC Granulocyte: 9.3 — AB (ref 2–6.9)
POC LYMPH PERCENT: 6.7 %L — AB (ref 10–50)
POC MID %: 3.9 %M (ref 0–12)
RBC: 4.95 M/uL (ref 4.69–6.13)
RDW, POC: 12.6 %
WBC: 10.4 10*3/uL — AB (ref 4.6–10.2)

## 2017-12-09 LAB — POCT URINALYSIS DIP (MANUAL ENTRY)
Bilirubin, UA: NEGATIVE
Blood, UA: NEGATIVE
GLUCOSE UA: NEGATIVE mg/dL
Leukocytes, UA: NEGATIVE
Nitrite, UA: NEGATIVE
Protein Ur, POC: NEGATIVE mg/dL
Spec Grav, UA: 1.025 (ref 1.010–1.025)
UROBILINOGEN UA: 1 U/dL
pH, UA: 6.5 (ref 5.0–8.0)

## 2017-12-09 LAB — LIPASE, BLOOD: LIPASE: 33 U/L (ref 11–51)

## 2017-12-09 LAB — GLUCOSE, POCT (MANUAL RESULT ENTRY): POC GLUCOSE: 172 mg/dL — AB (ref 70–99)

## 2017-12-09 MED ORDER — SODIUM CHLORIDE 0.9 % IV BOLUS
1000.0000 mL | Freq: Once | INTRAVENOUS | Status: AC
  Administered 2017-12-09: 1000 mL via INTRAVENOUS

## 2017-12-09 MED ORDER — ONDANSETRON HCL 4 MG/2ML IJ SOLN
4.0000 mg | Freq: Once | INTRAMUSCULAR | Status: AC
  Administered 2017-12-09: 4 mg via INTRAVENOUS
  Filled 2017-12-09: qty 2

## 2017-12-09 MED ORDER — IOHEXOL 300 MG/ML  SOLN
100.0000 mL | Freq: Once | INTRAMUSCULAR | Status: AC | PRN
  Administered 2017-12-09: 100 mL via INTRAVENOUS

## 2017-12-09 MED ORDER — MORPHINE SULFATE (PF) 4 MG/ML IV SOLN
4.0000 mg | Freq: Once | INTRAVENOUS | Status: AC
  Administered 2017-12-09: 4 mg via INTRAVENOUS
  Filled 2017-12-09: qty 1

## 2017-12-09 MED ORDER — KETOROLAC TROMETHAMINE 60 MG/2ML IM SOLN
60.0000 mg | Freq: Once | INTRAMUSCULAR | Status: AC
Start: 2017-12-09 — End: 2017-12-09
  Administered 2017-12-09: 60 mg via INTRAMUSCULAR

## 2017-12-09 NOTE — ED Provider Notes (Signed)
[facility] EMERGENCY DEPARTMENT Provider Note   CSN: [id number] Arrival date & time: 12/09/17  1422     History   Chief Complaint Chief Complaint  Patient presents with  . Flank Pain    HPI Austin Kent is a 57 y.o. male.  HPI   Patient is a 57 year old male with a history of type 2 diabetes, hypertension and gangrenous cholecystitis who presents to the emergency department for evaluation of right upper quadrant pain and nausea.  Patient reports that he developed constant nonradiating "aching" right upper quadrant pain today.  He states that the pain made him nauseous and he had several episodes of dry heaving.  Reports his symptoms are similar to when he had his cholecystitis in the past.  He went to urgent care who noted that he had a WBC of 10.5 and sent him here for further evaluation.  He denies fevers, chills, dysuria, urinary frequency, flank pain, testicular pain or scrotal swelling, diarrhea, constipation, lightheadedness, syncope, chest pain, shortness of breath.  According to chart review he had a subtotal cholecystectomy in the past, also had an abdominal hernia surgery as a child.  Past Medical History:  Diagnosis Date  . Allergy   . Anxiety   . Diabetes mellitus without complication ([place])   . Hypertension     Patient Active Problem List   Diagnosis Date Noted  . Right upper quadrant abdominal pain 12/09/2017  . Uncontrolled type 2 diabetes mellitus with hyperglycemia ([place]) 12/09/2017  . Cholecystitis 07/02/2015  . Nephrolithiasis 06/29/2015  . Cholelithiasis 06/29/2015  . Hypertension 03/18/2012    Past Surgical History:  Procedure Laterality Date  . CHOLECYSTECTOMY N/A 07/03/2015   Procedure: LAPAROSCOPIC  SUBTOTAL  CHOLECYSTECTOMY ;  Surgeon: Rolm Bookbinder, MD;  Location: [facility];  Service: General;  Laterality: N/A;  . HERNIA REPAIR    . NASAL SINUS SURGERY    . PILONIDAL CYST / SINUS EXCISION          Home  Medications    Prior to Admission medications   Medication Sig Start Date End Date Taking? Authorizing Provider  citalopram (CELEXA) 20 MG tablet TAKE 1 TABLET BY MOUTH EVERY DAY 10/18/12   Weber, Sarah L, PA-C  glimepiride (AMARYL) 2 MG tablet Take 2 mg by mouth daily with breakfast.    [provider]  hydrochlorothiazide (HYDRODIURIL) 12.5 MG tablet Take 12.5 mg by mouth daily.    [provider]  losartan-hydrochlorothiazide (HYZAAR) 50-12.5 MG tablet Take 1 tablet by mouth daily. 06/27/15   [provider]  metFORMIN (GLUCOPHAGE) 1000 MG tablet Take 1,000 mg by mouth 2 (two) times daily. 06/27/15   [provider]    Family History Family History  Problem Relation Age of Onset  . Hypertension Mother   . Anxiety disorder Father   . Anxiety disorder Daughter   . Anxiety disorder Son     Social History Social History   Tobacco Use  . Smoking status: Never Smoker  . Smokeless tobacco: Never Used  Substance Use Topics  . Alcohol use: No    Alcohol/week: 0.0 standard drinks  . Drug use: No     Allergies   Hctz [hydrochlorothiazide]; Lisinopril; and Amoxicillin   Review of Systems Review of Systems  Constitutional: Negative for chills and fever.  Respiratory: Negative for shortness of breath.   Cardiovascular: Negative for chest pain.  Gastrointestinal: Positive for abdominal pain and nausea. Negative for constipation, diarrhea and vomiting.  Genitourinary: Negative for difficulty urinating,  dysuria, flank pain, frequency, hematuria, scrotal swelling and testicular pain.  Musculoskeletal: Negative for back pain.  Skin: Negative for color change.  Neurological: Negative for dizziness, weakness and light-headedness.  Psychiatric/Behavioral: Negative for agitation.  All other systems reviewed and are negative.    Physical Exam Updated Vital Signs BP (!) 149/76   Pulse 70   Temp 99.5 F (37.5 C)   Resp 18   SpO2 100%   Physical  Exam  Constitutional: He appears well-developed and well-nourished. No distress.  No acute distress, nontoxic-appearing.  HENT:  Head: Normocephalic and atraumatic.  Mucous memories moist.  Eyes: Right eye exhibits no discharge. Left eye exhibits no discharge.  Neck: Normal range of motion. Neck supple.  Cardiovascular: Normal rate, regular rhythm and intact distal pulses.  Pulmonary/Chest: Effort normal and breath sounds normal. No stridor. No respiratory distress. He has no wheezes. He has no rales.  Abdominal:  Abdomen soft and nondistended.  Acutely tender to palpation in the right upper quadrant.  No guarding or rigidity.  No rebound tenderness.  No CVA tenderness.  + McMurphy sign.  Neurological: He is alert. Coordination normal.  Skin: Skin is warm and dry. He is not diaphoretic.  Psychiatric: He has a normal mood and affect. His behavior is normal.  Nursing note and vitals reviewed.  ED Treatments / Results  Labs (all labs ordered are listed, but only abnormal results are displayed) Labs Reviewed  COMPREHENSIVE METABOLIC PANEL - Abnormal; Notable for the following components:      Result Value   Glucose, Bld 123 (*)    BUN 22 (*)    Alkaline Phosphatase 36 (*)    All other components within normal limits  CBC - Abnormal; Notable for the following components:   WBC 14.2 (*)    All other components within normal limits  LIPASE, BLOOD  URINALYSIS, ROUTINE W REFLEX MICROSCOPIC  I-STAT CG4 LACTIC ACID, ED  I-STAT CG4 LACTIC ACID, ED    EKG None  Radiology Dg Abd Acute W/chest  Result Date: 12/09/2017 CLINICAL DATA:  Right upper quadrant pain EXAM: DG ABDOMEN ACUTE W/ 1V CHEST COMPARISON:  None. FINDINGS: Both lungs are clear.  Negative for heart failure or effusion. Normal bowel gas pattern. No obstruction or free air. Moderate stool in the right colon. Surgical clips in the gallbladder fossa. Negative for renal calculi. Normal skeletal structures. IMPRESSION: No active  cardiopulmonary disease Moderate stool in the right colon.  Normal bowel gas pattern. Electronically Signed   By: Franchot Gallo M.D.   On: 12/09/2017 12:13   US Abdomen Limited Ruq  Result Date: 12/09/2017 CLINICAL DATA:  57 year old with right upper quadrant pain. History of gangrenous cholecystitis and laparoscopic subtotal cholecystectomy on 07/03/2015. EXAM: ULTRASOUND ABDOMEN LIMITED RIGHT UPPER QUADRANT COMPARISON:  Ultrasound 06/29/2015 FINDINGS: Gallbladder: Evidence for an irregular fluid collection containing echogenic foci at the gallbladder fossa. Largest echogenic focus or stone measures roughly 8 mm. Common bile duct: Diameter: 4 mm. Liver: No focal lesion identified. Within normal limits in parenchymal echogenicity. Portal vein is patent on color Doppler imaging with normal direction of blood flow towards the liver. IMPRESSION: Irregular fluid collection at the gallbladder fossa with echogenic foci. Echogenic foci are suggestive for biliary stones. History of gangrenous cholecystitis and subtotal cholecystectomy. Not clear if this represents chronic postsurgical changes and may be better characterized with a CT of the abdomen / pelvis with IV and oral contrast. No biliary dilatation. Electronically Signed   By: Scherrie Gerlach.D.  On: 12/09/2017 18:21    Procedures Procedures (including critical care time)  Medications Ordered in ED Medications  sodium chloride 0.9 % bolus 1,000 mL (has no administration in time range)  morphine 4 MG/ML injection 4 mg (has no administration in time range)  ondansetron (ZOFRAN) injection 4 mg (has no administration in time range)  iohexol (OMNIPAQUE) 300 MG/ML solution 100 mL (100 mLs Intravenous Contrast Given 12/09/17 2246)     Initial Impression / Assessment and Plan / ED Course  I have reviewed the triage vital signs and the nursing notes.  Pertinent labs & imaging results that were available during my care of the patient were reviewed by me  and considered in my medical decision making (see chart for details).     Patient with RUQ pain, hx of subtotal cholecystectomy. RUQ ultrasound reveals irregular fluid collection at the gallbladder fossa with echogenic foci suggestive of biliary stones, recommend CT abdomen/pelvis for further evaluation.  Otherwise lab work reveals leukocytosis with WBC 14.2.  AST/ALT/alk phos WNL.  Lipase negative, doubt pancreatitis.  Patient treated with antiemetics, fluids and pain control.  Awaiting CT abdomen/pelvis.  He may require surgical consult depending on results of CT scan.  Signout given at shift change to PA [employer].   Final Clinical Impressions(s) / ED Diagnoses   Final diagnoses:  RUQ abdominal pain    ED Discharge Orders    None       Bernarda Caffey 12/09/17 2305    Gareth Morgan, MD 12/10/17 2227

## 2017-12-09 NOTE — Patient Instructions (Addendum)
  Go to the emergency room now for further evaluation and treatment.   If you have lab work done today you will be contacted with your lab results within the next 2 weeks.  If you have not heard from [place] then please contact us. The fastest way to get your results is to register for My Chart.   IF you received an x-ray today, you will receive an invoice from [facility]. Please contact [facility] at [phone] with questions or concerns regarding your invoice.   IF you received labwork today, you will receive an invoice from [facility]. Please contact [facility] at [phone] with questions or concerns regarding your invoice.   Our billing staff will not be able to assist you with questions regarding bills from these companies.  You will be contacted with the lab results as soon as they are available. The fastest way to get your results is to activate your My Chart account. Instructions are located on the last page of this paperwork. If you have not heard from [place] regarding the results in 2 weeks, please contact this office.     Abdominal Pain, Adult Many things can cause belly (abdominal) pain. Most times, belly pain is not dangerous. Many cases of belly pain can be watched and treated at home. Sometimes belly pain is serious, though. Your doctor will try to find the cause of your belly pain. Follow these instructions at home:  Take over-the-counter and prescription medicines only as told by your doctor. Do not take medicines that help you poop (laxatives) unless told to by your doctor.  Drink enough fluid to keep your pee (urine) clear or pale yellow.  Watch your belly pain for any changes.  Keep all follow-up visits as told by your doctor. This is important. Contact a doctor if:  Your belly pain changes or gets worse.  You are not hungry, or you lose weight without trying.  You are having trouble pooping (constipated) or have watery poop (diarrhea) for more than  2-3 days.  You have pain when you pee or poop.  Your belly pain wakes you up at night.  Your pain gets worse with meals, after eating, or with certain foods.  You are throwing up and cannot keep anything down.  You have a fever. Get help right away if:  Your pain does not go away as soon as your doctor says it should.  You cannot stop throwing up.  Your pain is only in areas of your belly, such as the right side or the left lower part of the belly.  You have bloody or black poop, or poop that looks like tar.  You have very bad pain, cramping, or bloating in your belly.  You have signs of not having enough fluid or water in your body (dehydration), such as: ? Dark pee, very little pee, or no pee. ? Cracked lips. ? Dry mouth. ? Sunken eyes. ? Sleepiness. ? Weakness. This information is not intended to replace advice given to you by your health care provider. Make sure you discuss any questions you have with your health care provider. Document Released: 08/01/2007 Document Revised: 09/02/2015 Document Reviewed: 07/27/2015 Elsevier Interactive Patient Education  2018 [employer].

## 2017-12-09 NOTE — ED Provider Notes (Addendum)
Patient placed in Quick Look pathway, seen and evaluated   Chief Complaint: RUQ abd pain  HPI:   PT presenting with constant RUQ abd pain, radiating to back, since 6am this morning. S/p cholecystectomy in May 2017.  UC sent here to eval for gallstone in bile duct. Pain treated at [facility] w toradol prior to arrival which alleviated pain. Pains severe prior to this. Assoc nausea. Denies D/C, fever, denies urinary sx.   ROS: + abd pain  Physical Exam:   Gen: No distress  Neuro: Awake and Alert  Skin: Warm    Focused Exam: RUQ TTP. No guarding or rebound. Abd is soft.   Initiation of care has begun. The patient has been counseled on the process, plan, and necessity for staying for the completion/evaluation, and the remainder of the medical screening examination    Chantrice Hagg, [place], PA-C 12/09/17 1458    Shameer Molstad, [place], PA-C 12/09/17 1500    Cathren Laine, MD 12/09/17 1541

## 2017-12-09 NOTE — ED Notes (Signed)
Patient returned from CT

## 2017-12-09 NOTE — Progress Notes (Signed)
Austin Kent 57 y.o.   Chief Complaint  Patient presents with  . Abdominal Pain    started this morning    HISTORY OF PRESENT ILLNESS: This is a 57 y.o. male complaining of right upper abdominal pain that started today at 6 in the morning.  Pain is sharp and radiates to the right scapular area.  Steady since onset.  Similar to gallbladder pain he used to have before cholecystectomy 2 years ago.  Denies fever or chills.  Feels nauseous, had dry heaves.  Denies urinary symptoms.  Denies blood in the urine.  HPI    Prior to Admission medications   Medication Sig Start Date End Date Taking? Authorizing Provider  citalopram (CELEXA) 20 MG tablet TAKE 1 TABLET BY MOUTH EVERY DAY 10/18/12  Yes Weber, Sarah L, PA-C  glimepiride (AMARYL) 2 MG tablet Take 2 mg by mouth daily with breakfast.   Yes [provider]  hydrochlorothiazide (HYDRODIURIL) 12.5 MG tablet Take 12.5 mg by mouth daily.   Yes [provider]  metFORMIN (GLUCOPHAGE) 1000 MG tablet Take 1,000 mg by mouth 2 (two) times daily. 06/27/15  Yes [provider]  losartan-hydrochlorothiazide (HYZAAR) 50-12.5 MG tablet Take 1 tablet by mouth daily. 06/27/15   [provider]  Rivaroxaban 15 & 20 MG TBPK Take as directed on package: Start with one 15mg  tablet by mouth twice a day with food. On Day 22, switch to one 20mg  tablet once a day with food. Patient not taking: Reported on 12/09/2017 10/01/15   Ward, Chase Picket, PA-C    Allergies  Allergen Reactions  . Hctz [Hydrochlorothiazide]   . Lisinopril Cough  . Amoxicillin Rash    Patient Active Problem List   Diagnosis Date Noted  . Cholecystitis 07/02/2015  . Nephrolithiasis 06/29/2015  . Cholelithiasis 06/29/2015  . Hypertension 03/18/2012    Past Medical History:  Diagnosis Date  . Allergy   . Anxiety   . Diabetes mellitus without complication (HCC)   . Hypertension     Past Surgical History:  Procedure Laterality Date  .  CHOLECYSTECTOMY N/A 07/03/2015   Procedure: LAPAROSCOPIC  SUBTOTAL  CHOLECYSTECTOMY ;  Surgeon: Emelia Loron, MD;  Location: [facility] OR;  Service: General;  Laterality: N/A;  . HERNIA REPAIR    . NASAL SINUS SURGERY    . PILONIDAL CYST / SINUS EXCISION      Social History   Socioeconomic History  . Marital status: Married    Spouse name: Not on file  . Number of children: Not on file  . Years of education: Not on file  . Highest education level: Not on file  Occupational History  . Not on file  Social Needs  . Financial resource strain: Not on file  . Food insecurity:    Worry: Not on file    Inability: Not on file  . Transportation needs:    Medical: Not on file    Non-medical: Not on file  Tobacco Use  . Smoking status: Never Smoker  . Smokeless tobacco: Never Used  Substance and Sexual Activity  . Alcohol use: No    Alcohol/week: 0.0 standard drinks  . Drug use: No  . Sexual activity: Not on file  Lifestyle  . Physical activity:    Days per week: Not on file    Minutes per session: Not on file  . Stress: Not on file  Relationships  . Social connections:    Talks on phone: Not on file    Gets  together: Not on file    Attends religious service: Not on file    Active member of club or organization: Not on file    Attends meetings of clubs or organizations: Not on file    Relationship status: Not on file  . Intimate partner violence:    Fear of current or ex partner: Not on file    Emotionally abused: Not on file    Physically abused: Not on file    Forced sexual activity: Not on file  Other Topics Concern  . Not on file  Social History Narrative  . Not on file    Family History  Problem Relation Age of Onset  . Hypertension Mother   . Anxiety disorder Father   . Anxiety disorder Daughter   . Anxiety disorder Son      Review of Systems  Constitutional: Negative.  Negative for chills and fever.  HENT: Negative.   Eyes: Negative.   Respiratory:  Negative.  Negative for cough and shortness of breath.   Cardiovascular: Negative.  Negative for chest pain and palpitations.  Gastrointestinal: Positive for abdominal pain and nausea. Negative for blood in stool, diarrhea, melena and vomiting.  Genitourinary: Negative.  Negative for dysuria and urgency.  Skin: Negative.  Negative for rash.  Neurological: Negative.  Negative for dizziness and headaches.  Endo/Heme/Allergies: Negative.   All other systems reviewed and are negative.    Vitals:   12/09/17 1139  BP: (!) 152/71  Pulse: 61  Resp: 16  Temp: 98.1 F (36.7 C)  SpO2: 99%     Physical Exam  Constitutional: He is oriented to person, place, and time. He appears well-developed and well-nourished.  HENT:  Head: Normocephalic and atraumatic.  Nose: Nose normal.  Mouth/Throat: Oropharynx is clear and moist.  Eyes: Pupils are equal, round, and reactive to light. Conjunctivae are normal.  Neck: Normal range of motion. Neck supple.  Cardiovascular: Normal rate and regular rhythm.  Pulmonary/Chest: Effort normal and breath sounds normal.  Abdominal: Soft. Normal appearance and bowel sounds are normal. He exhibits no distension, no pulsatile midline mass and no mass. There is no hepatosplenomegaly. There is tenderness in the right upper quadrant. There is no rigidity, no rebound, no guarding, no CVA tenderness and no tenderness at McBurney's point.  Musculoskeletal: Normal range of motion.  Neurological: He is alert and oriented to person, place, and time. No sensory deficit. He exhibits normal muscle tone.  Skin: Skin is warm and dry. Capillary refill takes less than 2 seconds.  Psychiatric: He has a normal mood and affect. His behavior is normal.  Vitals reviewed.  Results for orders placed or performed in visit on 12/09/17 (from the past 24 hour(s))  POCT CBC     Status: Abnormal   Collection Time: 12/09/17 12:14 PM  Result Value Ref Range   WBC 10.4 (A) 4.6 - 10.2 K/uL    Lymph, poc 0.7 0.6 - 3.4   POC LYMPH PERCENT 6.7 (A) 10 - 50 %L   MID (cbc) 0.4 0 - 0.9   POC MID % 3.9 0 - 12 %M   POC Granulocyte 9.3 (A) 2 - 6.9   Granulocyte percent 89.4 (A) 37 - 80 %G   RBC 4.95 4.69 - 6.13 M/uL   Hemoglobin 15.5 14.1 - 18.1 g/dL   HCT, POC 16.1 09.6 - 53.7 %   MCV 93.3 80 - 97 fL   MCH, POC 31.4 (A) 27 - 31.2 pg   MCHC 33.7 31.8 -  35.4 g/dL   RDW, POC 40.9 %   Platelet Count, POC 282 142 - 424 K/uL   MPV 6.5 0 - 99.8 fL  POCT urinalysis dipstick     Status: Abnormal   Collection Time: 12/09/17 12:25 PM  Result Value Ref Range   Color, UA yellow yellow   Clarity, UA clear clear   Glucose, UA negative negative mg/dL   Bilirubin, UA negative negative   Ketones, POC UA moderate (40) (A) negative mg/dL   Spec Grav, UA 8.119 1.478 - 1.025   Blood, UA negative negative   pH, UA 6.5 5.0 - 8.0   Protein Ur, POC negative negative mg/dL   Urobilinogen, UA 1.0 0.2 or 1.0 E.U./dL   Nitrite, UA Negative Negative   Leukocytes, UA Negative Negative  POCT glucose (manual entry)     Status: Abnormal   Collection Time: 12/09/17 12:35 PM  Result Value Ref Range   POC Glucose 172 (A) 70 - 99 mg/dl   Dg Abd Acute W/chest  Result Date: 12/09/2017 CLINICAL DATA:  Right upper quadrant pain EXAM: DG ABDOMEN ACUTE W/ 1V CHEST COMPARISON:  None. FINDINGS: Both lungs are clear.  Negative for heart failure or effusion. Normal bowel gas pattern. No obstruction or free air. Moderate stool in the right colon. Surgical clips in the gallbladder fossa. Negative for renal calculi. Normal skeletal structures. IMPRESSION: No active cardiopulmonary disease Moderate stool in the right colon.  Normal bowel gas pattern. Electronically Signed   By: Marlan Palau M.D.   On: 12/09/2017 12:13   A total of 40 minutes was spent in the room with the patient, greater than 50% of which was in counseling/coordination of care regarding differential diagnosis, treatment, and need for further diagnostic  testing and management in the emergency room now.   ASSESSMENT & PLAN: Mathis was seen today for abdominal pain.  Diagnoses and all orders for this visit:  Right upper quadrant abdominal pain Comments: Rule out choledocholithiasis Orders: -     Comprehensive metabolic panel -     POCT CBC -     DG Abd Acute W/Chest; Future -     ketorolac (TORADOL) injection 60 mg -     Lipase -     POCT urinalysis dipstick -     POCT glucose (manual entry)  Uncontrolled type 2 diabetes mellitus with hyperglycemia [facility])    Patient Instructions    Go to the emergency room now for further evaluation and treatment.   If you have lab work done today you will be contacted with your lab results within the next 2 weeks.  If you have not heard from [place] then please contact us. The fastest way to get your results is to register for My Chart.   IF you received an x-ray today, you will receive an invoice from [facility]. Please contact [facility] at [phone] with questions or concerns regarding your invoice.   IF you received labwork today, you will receive an invoice from [facility] [place]. Please contact [facility] at [phone] with questions or concerns regarding your invoice.   Our billing staff will not be able to assist you with questions regarding bills from these companies.  You will be contacted with the lab results as soon as they are available. The fastest way to get your results is to activate your My Chart account. Instructions are located on the last page of this paperwork. If you have not heard from [place] regarding the results in 2 weeks, please contact  this office.     Abdominal Pain, Adult Many things can cause belly (abdominal) pain. Most times, belly pain is not dangerous. Many cases of belly pain can be watched and treated at home. Sometimes belly pain is serious, though. Your doctor will try to find the cause of your belly pain. Follow these instructions at  home:  Take over-the-counter and prescription medicines only as told by your doctor. Do not take medicines that help you poop (laxatives) unless told to by your doctor.  Drink enough fluid to keep your pee (urine) clear or pale yellow.  Watch your belly pain for any changes.  Keep all follow-up visits as told by your doctor. This is important. Contact a doctor if:  Your belly pain changes or gets worse.  You are not hungry, or you lose weight without trying.  You are having trouble pooping (constipated) or have watery poop (diarrhea) for more than 2-3 days.  You have pain when you pee or poop.  Your belly pain wakes you up at night.  Your pain gets worse with meals, after eating, or with certain foods.  You are throwing up and cannot keep anything down.  You have a fever. Get help right away if:  Your pain does not go away as soon as your doctor says it should.  You cannot stop throwing up.  Your pain is only in areas of your belly, such as the right side or the left lower part of the belly.  You have bloody or black poop, or poop that looks like tar.  You have very bad pain, cramping, or bloating in your belly.  You have signs of not having enough fluid or water in your body (dehydration), such as: ? Dark pee, very little pee, or no pee. ? Cracked lips. ? Dry mouth. ? Sunken eyes. ? Sleepiness. ? Weakness. This information is not intended to replace advice given to you by your health care provider. Make sure you discuss any questions you have with your health care provider. Document Released: 08/01/2007 Document Revised: 09/02/2015 Document Reviewed: 07/27/2015 Elsevier Interactive Patient Education  2018 Elsevier Inc.      Edwina Barth, MD [facility]

## 2017-12-09 NOTE — ED Triage Notes (Signed)
Pt in c/o abdominal pain since this morning with n/v, went to an urgent care and sent here

## 2017-12-10 ENCOUNTER — Other Ambulatory Visit: Payer: Self-pay

## 2017-12-10 ENCOUNTER — Encounter (HOSPITAL_COMMUNITY): Payer: Self-pay | Admitting: General Practice

## 2017-12-10 ENCOUNTER — Inpatient Hospital Stay (HOSPITAL_COMMUNITY): Payer: BLUE CROSS/BLUE SHIELD

## 2017-12-10 DIAGNOSIS — K8000 Calculus of gallbladder with acute cholecystitis without obstruction: Secondary | ICD-10-CM | POA: Diagnosis not present

## 2017-12-10 DIAGNOSIS — E119 Type 2 diabetes mellitus without complications: Secondary | ICD-10-CM | POA: Diagnosis not present

## 2017-12-10 DIAGNOSIS — Z8249 Family history of ischemic heart disease and other diseases of the circulatory system: Secondary | ICD-10-CM | POA: Diagnosis not present

## 2017-12-10 DIAGNOSIS — K82A1 Gangrene of gallbladder in cholecystitis: Secondary | ICD-10-CM | POA: Diagnosis not present

## 2017-12-10 DIAGNOSIS — I10 Essential (primary) hypertension: Secondary | ICD-10-CM | POA: Diagnosis not present

## 2017-12-10 DIAGNOSIS — E1165 Type 2 diabetes mellitus with hyperglycemia: Secondary | ICD-10-CM | POA: Diagnosis present

## 2017-12-10 DIAGNOSIS — K8010 Calculus of gallbladder with chronic cholecystitis without obstruction: Secondary | ICD-10-CM | POA: Diagnosis not present

## 2017-12-10 DIAGNOSIS — K810 Acute cholecystitis: Secondary | ICD-10-CM | POA: Diagnosis present

## 2017-12-10 DIAGNOSIS — R1011 Right upper quadrant pain: Secondary | ICD-10-CM | POA: Diagnosis not present

## 2017-12-10 LAB — COMPREHENSIVE METABOLIC PANEL
A/G RATIO: 2.4 — AB (ref 1.2–2.2)
ALBUMIN: 5.1 g/dL (ref 3.5–5.5)
ALT: 23 IU/L (ref 0–44)
AST: 24 IU/L (ref 0–40)
Alkaline Phosphatase: 44 IU/L (ref 39–117)
BILIRUBIN TOTAL: 0.7 mg/dL (ref 0.0–1.2)
BUN / CREAT RATIO: 23 — AB (ref 9–20)
BUN: 19 mg/dL (ref 6–24)
CALCIUM: 10 mg/dL (ref 8.7–10.2)
CHLORIDE: 107 mmol/L — AB (ref 96–106)
CO2: 19 mmol/L — ABNORMAL LOW (ref 20–29)
Creatinine, Ser: 0.84 mg/dL (ref 0.76–1.27)
GFR, EST AFRICAN AMERICAN: 112 mL/min/{1.73_m2} (ref >59)
GFR, EST NON AFRICAN AMERICAN: 97 mL/min/{1.73_m2} (ref >59)
GLOBULIN, TOTAL: 2.1 g/dL (ref 1.5–4.5)
Glucose: 185 mg/dL — ABNORMAL HIGH (ref 65–99)
POTASSIUM: 4.2 mmol/L (ref 3.5–5.2)
SODIUM: 144 mmol/L (ref 134–144)
TOTAL PROTEIN: 7.2 g/dL (ref 6.0–8.5)

## 2017-12-10 LAB — SURGICAL PCR SCREEN
MRSA, PCR: NEGATIVE
Staphylococcus aureus: NEGATIVE

## 2017-12-10 LAB — GLUCOSE, CAPILLARY
GLUCOSE-CAPILLARY: 121 mg/dL — AB (ref 70–99)
Glucose-Capillary: 121 mg/dL — ABNORMAL HIGH (ref 70–99)
Glucose-Capillary: 134 mg/dL — ABNORMAL HIGH (ref 70–99)
Glucose-Capillary: 139 mg/dL — ABNORMAL HIGH (ref 70–99)
Glucose-Capillary: 144 mg/dL — ABNORMAL HIGH (ref 70–99)

## 2017-12-10 LAB — LIPASE: Lipase: 22 U/L (ref 13–78)

## 2017-12-10 LAB — HIV ANTIBODY (ROUTINE TESTING W REFLEX): HIV Screen 4th Generation wRfx: NONREACTIVE

## 2017-12-10 MED ORDER — HYDROCHLOROTHIAZIDE 25 MG PO TABS: 12.5000 mg | ORAL_TABLET | Freq: Every day | ORAL | Status: DC

## 2017-12-10 MED ORDER — MORPHINE SULFATE (PF) 4 MG/ML IV SOLN: 3.0000 mg | Freq: Once | INTRAVENOUS | Status: AC

## 2017-12-10 MED ORDER — ONDANSETRON 4 MG PO TBDP: 4.0000 mg | ORAL_TABLET | Freq: Four times a day (QID) | ORAL | Status: DC | PRN

## 2017-12-10 MED ORDER — LOSARTAN POTASSIUM-HCTZ 50-12.5 MG PO TABS: 1.0000 | ORAL_TABLET | Freq: Every day | ORAL | Status: DC

## 2017-12-10 MED ORDER — SODIUM CHLORIDE 0.9 % IV SOLN
2.0000 g | Freq: Once | INTRAVENOUS | Status: AC
  Administered 2017-12-10: 2 g via INTRAVENOUS
  Filled 2017-12-10: qty 20

## 2017-12-10 MED ORDER — HYDROMORPHONE HCL 1 MG/ML IJ SOLN
1.0000 mg | INTRAMUSCULAR | Status: DC | PRN
  Administered 2017-12-11 (×2): 1 mg via INTRAVENOUS
  Filled 2017-12-10 (×2): qty 1

## 2017-12-10 MED ORDER — ONDANSETRON HCL 4 MG/2ML IJ SOLN: 4.0000 mg | Freq: Four times a day (QID) | INTRAMUSCULAR | Status: DC | PRN

## 2017-12-10 MED ORDER — OXYCODONE HCL 5 MG PO TABS
5.0000 mg | ORAL_TABLET | ORAL | Status: DC | PRN
  Administered 2017-12-11 (×2): 10 mg via ORAL
  Administered 2017-12-12: 5 mg via ORAL
  Filled 2017-12-10: qty 2
  Filled 2017-12-10: qty 1
  Filled 2017-12-10: qty 2

## 2017-12-10 MED ORDER — MORPHINE SULFATE (PF) 4 MG/ML IV SOLN
INTRAVENOUS | Status: AC
  Filled 2017-12-10: qty 1

## 2017-12-10 MED ORDER — CIPROFLOXACIN IN D5W 400 MG/200ML IV SOLN
400.0000 mg | Freq: Two times a day (BID) | INTRAVENOUS | Status: DC
  Administered 2017-12-10 – 2017-12-12 (×5): 400 mg via INTRAVENOUS
  Filled 2017-12-10 (×5): qty 200

## 2017-12-10 MED ORDER — METOPROLOL TARTRATE 5 MG/5ML IV SOLN: 5.0000 mg | Freq: Four times a day (QID) | INTRAVENOUS | Status: DC | PRN

## 2017-12-10 MED ORDER — ENOXAPARIN SODIUM 40 MG/0.4ML ~~LOC~~ SOLN: 40.0000 mg | Freq: Every day | SUBCUTANEOUS | Status: DC

## 2017-12-10 MED ORDER — TECHNETIUM TC 99M MEBROFENIN IV KIT
5.0000 | PACK | Freq: Once | INTRAVENOUS | Status: AC | PRN
  Administered 2017-12-10: 5 via INTRAVENOUS

## 2017-12-10 MED ORDER — INSULIN ASPART 100 UNIT/ML ~~LOC~~ SOLN
0.0000 [IU] | SUBCUTANEOUS | Status: DC
  Administered 2017-12-10 – 2017-12-11 (×6): 2 [IU] via SUBCUTANEOUS

## 2017-12-10 MED ORDER — CITALOPRAM HYDROBROMIDE 20 MG PO TABS
20.0000 mg | ORAL_TABLET | Freq: Every day | ORAL | Status: DC
  Administered 2017-12-11 – 2017-12-12 (×2): 20 mg via ORAL
  Filled 2017-12-10 (×2): qty 1

## 2017-12-10 MED ORDER — ACETAMINOPHEN 325 MG PO TABS
650.0000 mg | ORAL_TABLET | Freq: Four times a day (QID) | ORAL | Status: DC | PRN
  Administered 2017-12-10: 650 mg via ORAL
  Filled 2017-12-10: qty 2

## 2017-12-10 MED ORDER — SODIUM CHLORIDE 0.9 % IV SOLN
INTRAVENOUS | Status: DC
  Administered 2017-12-10 – 2017-12-12 (×6): via INTRAVENOUS

## 2017-12-10 MED ORDER — SODIUM CHLORIDE 0.9 % IV SOLN
Freq: Once | INTRAVENOUS | Status: AC
  Administered 2017-12-10: 01:00:00 via INTRAVENOUS

## 2017-12-10 NOTE — Progress Notes (Signed)
[facility]/Trauma Progress Note      Assessment/Plan DM - SSI HTN - home meds  Recurrent cholecystitis after partial cholecystectomy - CT showed fluid collection within the gallbladder fossa on ultrasound appears to be residual gallbladder with wall thickening, cholelithiasis, and pericholecystic fat stranding - US showed Irregular fluid collection at the gallbladder fossa with echogenic foci. - WBC 14.2 - - HIDA pending, possible OR tomorrow  FEN: CLD after HIDA VTE: SCD's, lovenox ID: Rocephin 10/15; Cipro 10/15>> Foley: none Follow up: TBD    LOS: 0 days    Subjective: CC: abdominal pain  Pain improved. No nausea or vomiting overnight. No family at bedside. Discussed plan.   Objective: Vital signs in last 24 hours: Temp:  [98 F (36.7 C)-100.1 F (37.8 C)] 100.1 F (37.8 C) (10/15 0313) Pulse Rate:  [61-89] 78 (10/15 0313) Resp:  [15-20] 16 (10/15 0313) BP: (130-156)/(64-80) 138/69 (10/15 0313) SpO2:  [96 %-100 %] 99 % (10/15 0313) Weight:  [103.1 kg] 103.1 kg (10/14 1139)    Intake/Output from previous day: 10/14 0701 - 10/15 0700 In: 1001.2 [IV Piggyback:1001.2] Out: -  Intake/Output this shift: No intake/output data recorded.  PE: Gen:  Alert, NAD, pleasant, cooperative Card:  RRR, no M/G/R heard Pulm:  CTA, no W/R/R, effort normal Abd: Soft, ND, +BS, TTP RUQ with mild guarding, no peritonitis  Skin: no rashes noted, warm and dry   Anti-infectives: Anti-infectives (From admission, onward)   Start     Dose/Rate Route Frequency Ordered Stop   12/10/17 0400  ciprofloxacin (CIPRO) IVPB 400 mg     400 mg 200 mL/hr over 60 Minutes Intravenous 2 times daily 12/10/17 0317     12/10/17 0100  cefTRIAXone (ROCEPHIN) 2 g in sodium chloride 0.9 % 100 mL IVPB     2 g 200 mL/hr over 30 Minutes Intravenous  Once 12/10/17 0049 12/10/17 0221      Lab Results:  Recent Labs    12/09/17 1214 12/09/17 2131  WBC 10.4* 14.2*  HGB 15.5 14.3  HCT  46.2 41.9  PLT  --  219   BMET Recent Labs    12/09/17 1223 12/09/17 2131  NA 144 141  K 4.2 3.7  CL 107* 109  CO2 19* 24  GLUCOSE 185* 123*  BUN 19 22*  CREATININE 0.84 1.20  CALCIUM 10.0 9.3   PT/INR No results for input(s): LABPROT, INR in the last 72 hours. CMP     Component Value Date/Time   NA 141 12/09/2017 2131   NA 144 12/09/2017 1223   K 3.7 12/09/2017 2131   CL 109 12/09/2017 2131   CO2 24 12/09/2017 2131   GLUCOSE 123 (H) 12/09/2017 2131   BUN 22 (H) 12/09/2017 2131   BUN 19 12/09/2017 1223   CREATININE 1.20 12/09/2017 2131   CALCIUM 9.3 12/09/2017 2131   PROT 7.2 12/09/2017 2131   PROT 7.2 12/09/2017 1223   ALBUMIN 4.4 12/09/2017 2131   ALBUMIN 5.1 12/09/2017 1223   AST 26 12/09/2017 2131   ALT 24 12/09/2017 2131   ALKPHOS 36 (L) 12/09/2017 2131   BILITOT 0.9 12/09/2017 2131   BILITOT 0.7 12/09/2017 1223   GFRNONAA >60 12/09/2017 2131   GFRAA >60 12/09/2017 2131   Lipase     Component Value Date/Time   LIPASE 33 12/09/2017 2131    Studies/Results: Ct Abdomen Pelvis W Contrast  Result Date: 12/09/2017 CLINICAL DATA:  57 y/o M; RUQ pain, fever, elev WBC, Murphy's sign. History of laparoscopic  subtotal cholecystectomy in 2017. EXAM: CT ABDOMEN AND PELVIS WITH CONTRAST TECHNIQUE: Multidetector CT imaging of the abdomen and pelvis was performed using the standard protocol following bolus administration of intravenous contrast. CONTRAST:  100 cc Omnipaque 300. COMPARISON:  12/09/2017 abdominal ultrasound. FINDINGS: Lower chest: No acute abnormality. Hepatobiliary: No focal liver abnormality is seen. Partial cholecystectomy. The fluid collection within the gallbladder fossa on ultrasound appears to be residual gallbladder with wall thickening, cholelithiasis, and pericholecystic fat stranding. No intra or extrahepatic biliary ductal dilatation. Pancreas: Unremarkable. No pancreatic ductal dilatation or surrounding inflammatory changes. Spleen: Normal in  size without focal abnormality. Adrenals/Urinary Tract: Adrenal glands are unremarkable. Subcentimeter hypodensities in the kidneys bilaterally are too small to characterize, but likely represent multiple cysts. Kidneys are otherwise normal, without renal calculi, focal lesion, or hydronephrosis. Bladder is unremarkable. Stomach/Bowel: Stomach is within normal limits. Appendix appears normal. No evidence of bowel wall thickening, distention, or inflammatory changes. Periampullary duodenum diverticulum measuring 17 mm without associated inflammatory changes (series 6, image 44). Vascular/Lymphatic: No significant vascular findings are present. No enlarged abdominal or pelvic lymph nodes. Reproductive: Unremarkable. Other: No abdominal wall hernia or abnormality. No abdominopelvic ascites. Musculoskeletal: No fracture is seen. Mild lumbar levocurvature and lower lumbar spondylosis with prominent facet arthropathy. IMPRESSION: 1. Partial cholecystectomy. The fluid collection within the gallbladder fossa on ultrasound appears to be residual gallbladder with wall thickening, cholelithiasis, and pericholecystic fat stranding. Findings are suspicious for acute cholecystitis of the residual gallbladder. 2. Otherwise unremarkable CT of abdomen and pelvis. Electronically Signed   By: Mitzi Hansen M.D.   On: 12/09/2017 23:03   Dg Abd Acute W/chest  Result Date: 12/09/2017 CLINICAL DATA:  Right upper quadrant pain EXAM: DG ABDOMEN ACUTE W/ 1V CHEST COMPARISON:  None. FINDINGS: Both lungs are clear.  Negative for heart failure or effusion. Normal bowel gas pattern. No obstruction or free air. Moderate stool in the right colon. Surgical clips in the gallbladder fossa. Negative for renal calculi. Normal skeletal structures. IMPRESSION: No active cardiopulmonary disease Moderate stool in the right colon.  Normal bowel gas pattern. Electronically Signed   By: Marlan Palau M.D.   On: 12/09/2017 12:13   US Abdomen  Limited Ruq  Result Date: 12/09/2017 CLINICAL DATA:  57 year old with right upper quadrant pain. History of gangrenous cholecystitis and laparoscopic subtotal cholecystectomy on 07/03/2015. EXAM: ULTRASOUND ABDOMEN LIMITED RIGHT UPPER QUADRANT COMPARISON:  Ultrasound 06/29/2015 FINDINGS: Gallbladder: Evidence for an irregular fluid collection containing echogenic foci at the gallbladder fossa. Largest echogenic focus or stone measures roughly 8 mm. Common bile duct: Diameter: 4 mm. Liver: No focal lesion identified. Within normal limits in parenchymal echogenicity. Portal vein is patent on color Doppler imaging with normal direction of blood flow towards the liver. IMPRESSION: Irregular fluid collection at the gallbladder fossa with echogenic foci. Echogenic foci are suggestive for biliary stones. History of gangrenous cholecystitis and subtotal cholecystectomy. Not clear if this represents chronic postsurgical changes and may be better characterized with a CT of the abdomen / pelvis with IV and oral contrast. No biliary dilatation. Electronically Signed   By: Richarda Overlie M.D.   On: 12/09/2017 18:21      Jerre Simon , [facility] 12/10/2017, 8:15 AM  Pager: [phone] Mon-Wed, Friday 7:00am-4:30pm Thurs 7am-11:30am  Consults: [phone]

## 2017-12-10 NOTE — ED Provider Notes (Addendum)
11:00 PM Case assumed in shift change from Shirlyn Goltz, PA-C.  In short, patient is a 57 year old male with hx of DM, HTN presenting to the emergency department for pain in his right upper quadrant with associated nausea and dry heaves.  Initially went to urgent care, but was directed to the emergency department for further evaluation.  Noted to have ascending leukocytosis from 10.5 earlier today up to 14.2.  His LFTs are generally reassuring.  Of note, patient has history of subtotal cholecystectomy in 2017 for management of gangrenous cholecystitis.  This was secondary to difficulty separating the posterior wall of the gallbladder from the liver.  Initial ultrasound was ordered in triage notable for fluid collection in the gallbladder fossa with echogenic foci suspected to represent biliary stones.  CT scan was recommended based on ultrasound findings.  CT pending at change of shift.  12:50 AM CT results not crossing over. Found in PACS as follows:  Impression: 1.  Partial cholecystectomy.  The fluid collection within the gallbladder fossa on ultrasound appears to be residual gallbladder wall thickening, cholelithiasis, and pericholecystic fat stranding.  Findings are suspicious for acute cholecystitis of the residual gallbladder. 2.  Otherwise unremarkable CT of the abdomen and pelvis.  The patient was reassessed.  He continues to have focal tenderness in the right upper quadrant with mild voluntary guarding.  Declines additional medication for pain at this time.  Patient started on Rocephin ordered from the ED antibiotics order set.  Will consult with general surgery.  1:08 AM Case discussed with Dr. Luisa Hart who will evaluate the patient in the emergency department.  Anticipate surgical admission.   Vitals:   12/09/17 2145 12/09/17 2200 12/09/17 2215 12/09/17 2230  BP: (!) 149/70 (!) 146/66 (!) 146/67 (!) 147/70  Pulse: 81 85 86 89  Resp:      Temp:      TempSrc:      SpO2: 98% 98%  97% 96%      Antony Madura, PA-C 12/10/17 0109    Antony Madura, PA-C 12/10/17 0110    Ward, Layla Maw, DO 12/10/17 [phone]

## 2017-12-10 NOTE — ED Notes (Signed)
Dr. Luisa Hart in to assess pt for admission.

## 2017-12-10 NOTE — Progress Notes (Signed)
Patient ID: Austin Kent, male   DOB: 1960/08/25, 57 y.o.   MRN: [id number] HIDA with no uptake of the GB. Will proceed tomorrow with lap vs open chole All risks and benefits were discussed with the patient to generally include: infection, bleeding, possible need for post op ERCP, damage to the bile ducts, and bile leak. Alternatives were offered and described.  All questions were answered and the patient voiced understanding of the procedure and wishes to proceed at this point with a laparoscopic cholecystectomy

## 2017-12-10 NOTE — Plan of Care (Signed)
  Problem: Nutrition: Goal: Adequate nutrition will be maintained Outcome: Progressing   Problem: Pain Managment: Goal: General experience of comfort will improve Outcome: Progressing   Problem: Nutrition: Goal: Adequate nutrition will be maintained Outcome: Progressing   Problem: Pain Managment: Goal: General experience of comfort will improve Outcome: Progressing   

## 2017-12-10 NOTE — H&P (Signed)
Austin Kent is an 57 y.o. male.   Chief Complaint: abdominal pain HPI: asked to see pt at the request of Dr Billy Fischer for 1 day hx of RUQ abdominal pain.  Hx of subtotal cholecystectomy in 2017 by Dr Donne Hazel.  No hx of pain like this since that time. U/S and CT show recurrent cholecystitis of remnant gallbladder  Past Medical History:  Diagnosis Date  . Allergy   . Anxiety   . Diabetes mellitus without complication ([place])   . Hypertension     Past Surgical History:  Procedure Laterality Date  . CHOLECYSTECTOMY N/A 07/03/2015   Procedure: LAPAROSCOPIC  SUBTOTAL  CHOLECYSTECTOMY ;  Surgeon: Rolm Bookbinder, MD;  Location: [facility];  Service: General;  Laterality: N/A;  . HERNIA REPAIR    . NASAL SINUS SURGERY    . PILONIDAL CYST / SINUS EXCISION      Family History  Problem Relation Age of Onset  . Hypertension Mother   . Anxiety disorder Father   . Anxiety disorder Daughter   . Anxiety disorder Son    Social History:  reports that he has never smoked. He has never used smokeless tobacco. He reports that he does not drink alcohol or use drugs.  Allergies:  Allergies  Allergen Reactions  . Hctz [Hydrochlorothiazide]   . Lisinopril Cough  . Amoxicillin Rash     (Not in a hospital admission)  Results for orders placed or performed during the hospital encounter of 12/09/17 (from the past 48 hour(s))  Lipase, blood     Status: None   Collection Time: 12/09/17  9:31 PM  Result Value Ref Range   Lipase 33 11 - 51 U/L    Comment: Performed at [facility], [place] [address], [place], [place] [address]  Comprehensive metabolic panel     Status: Abnormal   Collection Time: 12/09/17  9:31 PM  Result Value Ref Range   Sodium 141 135 - 145 mmol/L   Potassium 3.7 3.5 - 5.1 mmol/L   Chloride 109 98 - 111 mmol/L   CO2 24 22 - 32 mmol/L   Glucose, Bld 123 (H) 70 - 99 mg/dL   BUN 22 (H) 6 - 20 mg/dL   Creatinine, Ser 1.20 0.61 - 1.24 mg/dL   Calcium 9.3 8.9 - 10.3  mg/dL   Total Protein 7.2 6.5 - 8.1 g/dL   Albumin 4.4 3.5 - 5.0 g/dL   AST 26 15 - 41 U/L   ALT 24 0 - 44 U/L   Alkaline Phosphatase 36 (L) 38 - 126 U/L   Total Bilirubin 0.9 0.3 - 1.2 mg/dL   GFR calc non Af Amer >60 >60 mL/min   GFR calc Af Amer >60 >60 mL/min    Comment: (NOTE) The eGFR has been calculated using the CKD EPI equation. This calculation has not been validated in all clinical situations. eGFR's persistently <60 mL/min signify possible Chronic Kidney Disease.    Anion gap 8 5 - 15    Comment: Performed at [facility] [address], [place] [address]  CBC     Status: Abnormal   Collection Time: 12/09/17  9:31 PM  Result Value Ref Range   WBC 14.2 (H) 4.0 - 10.5 K/uL   RBC 4.60 4.22 - 5.81 MIL/uL   Hemoglobin 14.3 13.0 - 17.0 g/dL   HCT 41.9 39.0 - 52.0 %   MCV 91.1 80.0 - 100.0 fL   MCH 31.1 26.0 - 34.0 pg  MCHC 34.1 30.0 - 36.0 g/dL   RDW 12.8 11.5 - 15.5 %   Platelets 219 150 - 400 K/uL   nRBC 0.0 0.0 - 0.2 %    Comment: Performed at [facility], [place] [address], [place], [place] [address]  I-Stat CG4 Lactic Acid, ED     Status: None   Collection Time: 12/09/17  9:41 PM  Result Value Ref Range   Lactic Acid, Venous 1.59 0.5 - 1.9 mmol/L   Dg Abd Acute W/chest  Result Date: 12/09/2017 CLINICAL DATA:  Right upper quadrant pain EXAM: DG ABDOMEN ACUTE W/ 1V CHEST COMPARISON:  None. FINDINGS: Both lungs are clear.  Negative for heart failure or effusion. Normal bowel gas pattern. No obstruction or free air. Moderate stool in the right colon. Surgical clips in the gallbladder fossa. Negative for renal calculi. Normal skeletal structures. IMPRESSION: No active cardiopulmonary disease Moderate stool in the right colon.  Normal bowel gas pattern. Electronically Signed   By: Franchot Gallo M.D.   On: 12/09/2017 12:13   US Abdomen Limited Ruq  Result Date: 12/09/2017 CLINICAL DATA:  57 year old with right upper quadrant pain. History of  gangrenous cholecystitis and laparoscopic subtotal cholecystectomy on 07/03/2015. EXAM: ULTRASOUND ABDOMEN LIMITED RIGHT UPPER QUADRANT COMPARISON:  Ultrasound 06/29/2015 FINDINGS: Gallbladder: Evidence for an irregular fluid collection containing echogenic foci at the gallbladder fossa. Largest echogenic focus or stone measures roughly 8 mm. Common bile duct: Diameter: 4 mm. Liver: No focal lesion identified. Within normal limits in parenchymal echogenicity. Portal vein is patent on color Doppler imaging with normal direction of blood flow towards the liver. IMPRESSION: Irregular fluid collection at the gallbladder fossa with echogenic foci. Echogenic foci are suggestive for biliary stones. History of gangrenous cholecystitis and subtotal cholecystectomy. Not clear if this represents chronic postsurgical changes and may be better characterized with a CT of the abdomen / pelvis with IV and oral contrast. No biliary dilatation. Electronically Signed   By: Markus Daft M.D.   On: 12/09/2017 18:21    Review of Systems  Constitutional: Positive for malaise/fatigue.  Gastrointestinal: Positive for abdominal pain and nausea.  All other systems reviewed and are negative.   Blood pressure (!) 147/70, pulse 89, temperature 99.5 F (37.5 C), resp. rate 18, SpO2 96 %. Physical Exam  Constitutional: He appears well-developed and well-nourished.  HENT:  Head: Normocephalic and atraumatic.  Mouth/Throat: No oropharyngeal exudate.  Eyes: Pupils are equal, round, and reactive to light. No scleral icterus.  Neck: Normal range of motion. Neck supple.  Cardiovascular: Normal rate and regular rhythm.  Respiratory: Effort normal and breath sounds normal.  GI: There is tenderness in the right upper quadrant.    Musculoskeletal: Normal range of motion.  Neurological: He is alert.  Psychiatric: He has a normal mood and affect. His behavior is normal. Judgment and thought content normal.      Assessment/Plan Recurrent cholecystitis after partial cholecystectomy Admit  NPO   IV ABX May need HIDA VS completion cholecystectomy    DM 2 SSI   Turner Daniels, MD 12/10/2017, 1:23 AM

## 2017-12-10 NOTE — Plan of Care (Signed)
  Problem: Clinical Measurements: Goal: Ability to maintain clinical measurements within normal limits will improve Outcome: Progressing Goal: Diagnostic test results will improve Outcome: Progressing   Problem: Activity: Goal: Risk for activity intolerance will decrease Outcome: Progressing   Problem: Nutrition: Goal: Adequate nutrition will be maintained Outcome: Progressing   Problem: Pain Managment: Goal: General experience of comfort will improve Outcome: Progressing   

## 2017-12-11 ENCOUNTER — Inpatient Hospital Stay (HOSPITAL_COMMUNITY): Payer: BLUE CROSS/BLUE SHIELD | Admitting: Certified Registered"

## 2017-12-11 ENCOUNTER — Encounter (HOSPITAL_COMMUNITY): Admission: EM | Disposition: A | Discharge status: Home / Self Care | Payer: Self-pay | Source: Home / Self Care

## 2017-12-11 ENCOUNTER — Encounter (HOSPITAL_COMMUNITY): Payer: Self-pay | Admitting: Certified Registered"

## 2017-12-11 HISTORY — PX: CHOLECYSTECTOMY: SHX55

## 2017-12-11 LAB — GLUCOSE, CAPILLARY
GLUCOSE-CAPILLARY: 124 mg/dL — AB (ref 70–99)
GLUCOSE-CAPILLARY: 138 mg/dL — AB (ref 70–99)
GLUCOSE-CAPILLARY: 196 mg/dL — AB (ref 70–99)
Glucose-Capillary: 101 mg/dL — ABNORMAL HIGH (ref 70–99)
Glucose-Capillary: 164 mg/dL — ABNORMAL HIGH (ref 70–99)
Glucose-Capillary: 191 mg/dL — ABNORMAL HIGH (ref 70–99)

## 2017-12-11 SURGERY — LAPAROSCOPIC CHOLECYSTECTOMY
Anesthesia: General | Site: Abdomen

## 2017-12-11 MED ORDER — LIDOCAINE 2% (20 MG/ML) 5 ML SYRINGE
INTRAMUSCULAR | Status: AC
  Filled 2017-12-11: qty 5

## 2017-12-11 MED ORDER — MIDAZOLAM HCL 5 MG/5ML IJ SOLN
INTRAMUSCULAR | Status: DC | PRN
  Administered 2017-12-11: 2 mg via INTRAVENOUS

## 2017-12-11 MED ORDER — SUCCINYLCHOLINE CHLORIDE 200 MG/10ML IV SOSY
PREFILLED_SYRINGE | INTRAVENOUS | Status: DC | PRN
  Administered 2017-12-11: 120 mg via INTRAVENOUS

## 2017-12-11 MED ORDER — BUPIVACAINE HCL 0.25 % IJ SOLN
INTRAMUSCULAR | Status: DC | PRN
  Administered 2017-12-11: 5 mL

## 2017-12-11 MED ORDER — FENTANYL CITRATE (PF) 100 MCG/2ML IJ SOLN: 25.0000 ug | INTRAMUSCULAR | Status: DC | PRN

## 2017-12-11 MED ORDER — INSULIN ASPART 100 UNIT/ML ~~LOC~~ SOLN
0.0000 [IU] | Freq: Three times a day (TID) | SUBCUTANEOUS | Status: DC
  Administered 2017-12-11 – 2017-12-12 (×3): 3 [IU] via SUBCUTANEOUS

## 2017-12-11 MED ORDER — ROCURONIUM BROMIDE 10 MG/ML (PF) SYRINGE
PREFILLED_SYRINGE | INTRAVENOUS | Status: DC | PRN
  Administered 2017-12-11: 50 mg via INTRAVENOUS
  Administered 2017-12-11: 10 mg via INTRAVENOUS

## 2017-12-11 MED ORDER — FENTANYL CITRATE (PF) 250 MCG/5ML IJ SOLN
INTRAMUSCULAR | Status: AC
  Filled 2017-12-11: qty 5

## 2017-12-11 MED ORDER — LIDOCAINE 2% (20 MG/ML) 5 ML SYRINGE
INTRAMUSCULAR | Status: DC | PRN
  Administered 2017-12-11: 100 mg via INTRAVENOUS

## 2017-12-11 MED ORDER — ROCURONIUM BROMIDE 50 MG/5ML IV SOSY
PREFILLED_SYRINGE | INTRAVENOUS | Status: AC
  Filled 2017-12-11: qty 5

## 2017-12-11 MED ORDER — PHENYLEPHRINE 40 MCG/ML (10ML) SYRINGE FOR IV PUSH (FOR BLOOD PRESSURE SUPPORT)
PREFILLED_SYRINGE | INTRAVENOUS | Status: AC
  Filled 2017-12-11: qty 10

## 2017-12-11 MED ORDER — ONDANSETRON HCL 4 MG/2ML IJ SOLN
INTRAMUSCULAR | Status: AC
  Filled 2017-12-11: qty 2

## 2017-12-11 MED ORDER — METOCLOPRAMIDE HCL 5 MG/ML IJ SOLN: 10.0000 mg | Freq: Once | INTRAMUSCULAR | Status: DC | PRN

## 2017-12-11 MED ORDER — BUPIVACAINE HCL (PF) 0.25 % IJ SOLN
INTRAMUSCULAR | Status: AC
  Filled 2017-12-11: qty 30

## 2017-12-11 MED ORDER — ONDANSETRON HCL 4 MG/2ML IJ SOLN
INTRAMUSCULAR | Status: DC | PRN
  Administered 2017-12-11: 4 mg via INTRAVENOUS

## 2017-12-11 MED ORDER — FENTANYL CITRATE (PF) 250 MCG/5ML IJ SOLN
INTRAMUSCULAR | Status: DC | PRN
  Administered 2017-12-11: 50 ug via INTRAVENOUS
  Administered 2017-12-11: 100 ug via INTRAVENOUS
  Administered 2017-12-11 (×2): 50 ug via INTRAVENOUS

## 2017-12-11 MED ORDER — MEPERIDINE HCL 50 MG/ML IJ SOLN: 6.2500 mg | INTRAMUSCULAR | Status: DC | PRN

## 2017-12-11 MED ORDER — SODIUM CHLORIDE 0.9 % IR SOLN
Status: DC | PRN
  Administered 2017-12-11: 1

## 2017-12-11 MED ORDER — DEXAMETHASONE SODIUM PHOSPHATE 10 MG/ML IJ SOLN
INTRAMUSCULAR | Status: DC | PRN
  Administered 2017-12-11: 5 mg via INTRAVENOUS

## 2017-12-11 MED ORDER — DEXAMETHASONE SODIUM PHOSPHATE 10 MG/ML IJ SOLN
INTRAMUSCULAR | Status: AC
  Filled 2017-12-11: qty 1

## 2017-12-11 MED ORDER — 0.9 % SODIUM CHLORIDE (POUR BTL) OPTIME
TOPICAL | Status: DC | PRN
  Administered 2017-12-11: 1000 mL

## 2017-12-11 MED ORDER — SUGAMMADEX SODIUM 500 MG/5ML IV SOLN
INTRAVENOUS | Status: AC
  Filled 2017-12-11: qty 5

## 2017-12-11 MED ORDER — PROPOFOL 10 MG/ML IV BOLUS
INTRAVENOUS | Status: DC | PRN
  Administered 2017-12-11: 200 mg via INTRAVENOUS

## 2017-12-11 MED ORDER — MIDAZOLAM HCL 2 MG/2ML IJ SOLN
INTRAMUSCULAR | Status: AC
  Filled 2017-12-11: qty 2

## 2017-12-11 MED ORDER — LACTATED RINGERS IV SOLN
INTRAVENOUS | Status: DC
  Administered 2017-12-11 (×2): via INTRAVENOUS

## 2017-12-11 MED ORDER — MENTHOL 3 MG MT LOZG
1.0000 | LOZENGE | OROMUCOSAL | Status: DC | PRN
  Administered 2017-12-11: 3 mg via ORAL
  Filled 2017-12-11: qty 9

## 2017-12-11 MED ORDER — LACTATED RINGERS IV SOLN: INTRAVENOUS | Status: DC

## 2017-12-11 MED ORDER — SUGAMMADEX SODIUM 200 MG/2ML IV SOLN
INTRAVENOUS | Status: DC | PRN
  Administered 2017-12-11: 400 mg via INTRAVENOUS

## 2017-12-11 MED ORDER — EPHEDRINE 5 MG/ML INJ
INTRAVENOUS | Status: AC
  Filled 2017-12-11: qty 10

## 2017-12-11 MED ORDER — PROPOFOL 10 MG/ML IV BOLUS
INTRAVENOUS | Status: AC
  Filled 2017-12-11: qty 20

## 2017-12-11 SURGICAL SUPPLY — 37 items
CANISTER SUCT 3000ML PPV (MISCELLANEOUS) ×2 IMPLANT
CHLORAPREP W/TINT 26ML (MISCELLANEOUS) ×2 IMPLANT
CLIP VESOLOCK MED LG 6/CT (CLIP) ×2 IMPLANT
COVER SURGICAL LIGHT HANDLE (MISCELLANEOUS) ×2 IMPLANT
COVER TRANSDUCER ULTRASND (DRAPES) ×2 IMPLANT
COVER WAND RF STERILE (DRAPES) ×2 IMPLANT
DEFOGGER SCOPE WARMER CLEARIFY (MISCELLANEOUS) ×2 IMPLANT
DERMABOND ADVANCED (GAUZE/BANDAGES/DRESSINGS) ×1
DERMABOND ADVANCED .7 DNX12 (GAUZE/BANDAGES/DRESSINGS) ×1 IMPLANT
ELECT REM PT RETURN 9FT ADLT (ELECTROSURGICAL) ×2
ELECTRODE REM PT RTRN 9FT ADLT (ELECTROSURGICAL) ×1 IMPLANT
GLOVE BIO SURGEON STRL SZ7.5 (GLOVE) ×2 IMPLANT
GOWN STRL REUS W/ TWL LRG LVL3 (GOWN DISPOSABLE) ×2 IMPLANT
GOWN STRL REUS W/ TWL XL LVL3 (GOWN DISPOSABLE) ×1 IMPLANT
GOWN STRL REUS W/TWL LRG LVL3 (GOWN DISPOSABLE) ×2
GOWN STRL REUS W/TWL XL LVL3 (GOWN DISPOSABLE) ×1
GRASPER SUT TROCAR 14GX15 (MISCELLANEOUS) ×2 IMPLANT
KIT BASIN OR (CUSTOM PROCEDURE TRAY) ×2 IMPLANT
KIT TURNOVER KIT B (KITS) ×2 IMPLANT
NEEDLE INSUFFLATION 14GA 120MM (NEEDLE) ×2 IMPLANT
NS IRRIG 1000ML POUR BTL (IV SOLUTION) ×2 IMPLANT
PAD ARMBOARD 7.5X6 YLW CONV (MISCELLANEOUS) ×2 IMPLANT
POUCH LAPAROSCOPIC INSTRUMENT (MISCELLANEOUS) ×2 IMPLANT
POUCH RETRIEVAL ECOSAC 10 (ENDOMECHANICALS) IMPLANT
POUCH RETRIEVAL ECOSAC 10MM (ENDOMECHANICALS)
SCISSORS LAP 5X35 DISP (ENDOMECHANICALS) ×2 IMPLANT
SET IRRIG TUBING LAPAROSCOPIC (IRRIGATION / IRRIGATOR) ×2 IMPLANT
SLEEVE ENDOPATH XCEL 5M (ENDOMECHANICALS) ×2 IMPLANT
SPECIMEN JAR SMALL (MISCELLANEOUS) ×2 IMPLANT
SUT MNCRL AB 4-0 PS2 18 (SUTURE) ×2 IMPLANT
TOWEL OR 17X24 6PK STRL BLUE (TOWEL DISPOSABLE) ×2 IMPLANT
TOWEL OR 17X26 10 PK STRL BLUE (TOWEL DISPOSABLE) ×2 IMPLANT
TRAY LAPAROSCOPIC MC (CUSTOM PROCEDURE TRAY) ×2 IMPLANT
TROCAR XCEL NON-BLD 11X100MML (ENDOMECHANICALS) ×2 IMPLANT
TROCAR XCEL NON-BLD 5MMX100MML (ENDOMECHANICALS) ×2 IMPLANT
TUBING INSUFFLATION (TUBING) ×2 IMPLANT
WATER STERILE IRR 1000ML POUR (IV SOLUTION) ×2 IMPLANT

## 2017-12-11 NOTE — Anesthesia Preprocedure Evaluation (Signed)
Anesthesia Evaluation  Patient identified by MRN, date of birth, ID band Patient awake    Reviewed: Allergy & Precautions, NPO status , Patient's Chart, lab work & pertinent test results  Airway Mallampati: II  TM Distance: >3 FB Neck ROM: Full    Dental no notable dental hx.    Pulmonary neg pulmonary ROS,    Pulmonary exam normal breath sounds clear to auscultation       Cardiovascular hypertension, Pt. on medications Normal cardiovascular exam Rhythm:Regular Rate:Normal     Neuro/Psych negative neurological ROS  negative psych ROS   GI/Hepatic negative GI ROS, Neg liver ROS,   Endo/Other  diabetes, Type 2, Oral Hypoglycemic Agents  Renal/GU negative Renal ROS  negative genitourinary   Musculoskeletal negative musculoskeletal ROS (+)   Abdominal   Peds negative pediatric ROS (+)  Hematology negative hematology ROS (+)   Anesthesia Other Findings   Reproductive/Obstetrics negative OB ROS                             Anesthesia Physical Anesthesia Plan  ASA: II  Anesthesia Plan: General   Post-op Pain Management:    Induction: Intravenous  PONV Risk Score and Plan: 3 and Ondansetron, Treatment may vary due to age or medical condition and Midazolam  Airway Management Planned: Oral ETT  Additional Equipment:   Intra-op Plan:   Post-operative Plan: Extubation in OR  Informed Consent: I have reviewed the patients History and Physical, chart, labs and discussed the procedure including the risks, benefits and alternatives for the proposed anesthesia with the patient or authorized representative who has indicated his/her understanding and acceptance.   Dental advisory given  Plan Discussed with: CRNA  Anesthesia Plan Comments:         Anesthesia Quick Evaluation

## 2017-12-11 NOTE — Op Note (Signed)
12/11/2017  11:48 AM  PATIENT:  Austin Kent  57 y.o. male  PRE-OPERATIVE DIAGNOSIS:  acute cholecystitis  POST-OPERATIVE DIAGNOSIS:  acute cholecystitis  PROCEDURE:  Procedure(s): LAPAROSCOPIC  CHOLECYSTECTOMY (N/A)  SURGEON:  Surgeon(s) and Role:    * Axel Filler, MD - Primary  ANESTHESIA:   local  EBL:  minimal   BLOOD ADMINISTERED:none  DRAINS: none   LOCAL MEDICATIONS USED:  BUPIVICAINE   SPECIMEN:  Source of Specimen:  gallbladder  DISPOSITION OF SPECIMEN:  PATHOLOGY  COUNTS:  YES  TOURNIQUET:  * No tourniquets in log *  DICTATION: .Dragon Dictation The patient was taken to the operating and placed in the supine position with bilateral SCDs in place.  The patient was prepped and draped in the usual sterile fashion. A time out was called and all facts were verified. A pneumoperitoneum was obtained via A Veress needle technique to a pressure of 14mm of mercury in the left upper quadrant.  A 5mm trocar was placed and a 5mm camera and there was no injury to any surrounding viscera.  There was some adhesions to the liver from the abdominal wall.  A 5mm trochar was then placed in the right upper quadrant under visualization. A 11 mm port was then placed in the umbilical region after infiltrating with local anesthesia under direct visualization. A second and third epigastric port and right lower quadrant port placement under direct visualization, respectively.    There was some dense adhesions to the liver and the gallbladder.  This omentum was adherhent to the the gallbladder and liver.  These were taken down bluntly and with cautery to have hemostasis. The gallbladder was identified and retracted and inflamed.  Calot's triangle was very inflamed with adhesions.  Due to this I proceeded to take the gallbladder dome down. This dissection was carried down to Calot's triangle. The gallbladder was identified and stripped away circumferentially and seen going into the  gallbladder 360, the critical angle was obtained.  2 clips were placed proximally one distally and the cystic duct transected. The cystic artery was identified and 2 clips placed proximally and one distally and transected.  A retrieval bag was then placed in the abdomen and gallbladder placed in the bag. The hepatic fossa was then reexamined and hemostasis was achieved with Bovie cautery and was excellent at the end of the case.   The subhepatic fossa and perihepatic fossa was then irrigated until the effluent was clear.  The gallbladder and bag were removed from the abdominal cavity. The 11 mm trocar fascia was reapproximated with the Endo Close #1 Vicryl  X 3.  The pneumoperitoneum was evacuated and all trochars removed under direct visulalization.  The skin was then closed with 4-0 Monocryl and the skin dressed with Dermabond.    The patient was awaken from general anesthesia and taken to the recovery room in stable condition.  PLAN OF CARE: Admit for overnight observation  PATIENT DISPOSITION:  PACU - hemodynamically stable.   Delay start of Pharmacological VTE agent (>24hrs) due to surgical blood loss or risk of bleeding: no

## 2017-12-11 NOTE — Plan of Care (Signed)
  Problem: Education: Goal: Knowledge of General Education information will improve Description Including pain rating scale, medication(s)/side effects and non-pharmacologic comfort measures Outcome: Progressing   Problem: Clinical Measurements: Goal: Ability to maintain clinical measurements within normal limits will improve Outcome: Progressing Goal: Diagnostic test results will improve Outcome: Progressing Goal: Respiratory complications will improve Outcome: Progressing Goal: Cardiovascular complication will be avoided Outcome: Progressing   Problem: Nutrition: Goal: Adequate nutrition will be maintained Outcome: Progressing   Problem: Elimination: Goal: Will not experience complications related to bowel motility Outcome: Progressing   Problem: Pain Managment: Goal: General experience of comfort will improve Outcome: Progressing

## 2017-12-11 NOTE — Anesthesia Procedure Notes (Signed)
Procedure Name: Intubation Date/Time: 12/11/2017 10:11 AM Performed by: Lucinda Dell, CRNA Pre-anesthesia Checklist: Patient identified, Emergency Drugs available, Suction available and Patient being monitored Patient Re-evaluated:Patient Re-evaluated prior to induction Oxygen Delivery Method: Circle system utilized Preoxygenation: Pre-oxygenation with 100% oxygen Induction Type: IV induction Ventilation: Mask ventilation with difficulty Laryngoscope Size: Glidescope and 4 Tube type: Oral Tube size: 7.5 mm Number of attempts: 1 Airway Equipment and Method: Stylet and Video-laryngoscopy Placement Confirmation: ETT inserted through vocal cords under direct vision,  positive ETCO2 and breath sounds checked- equal and bilateral Secured at: 22 cm Tube secured with: Tape Dental Injury: Teeth and Oropharynx as per pre-operative assessment  Comments: Difficult to mask. Used Anectine. DL with Glidescope d/t previous history of difficult intubation. Good view with glide, atraumatic intubation.

## 2017-12-11 NOTE — Transfer of Care (Signed)
Immediate Anesthesia Transfer of Care Note  Patient: Austin Kent  Procedure(s) Performed: LAPAROSCOPIC  CHOLECYSTECTOMY (N/A Abdomen)  Patient Location: PACU  Anesthesia Type:General  Level of Consciousness: awake, alert , oriented and patient cooperative  Airway & Oxygen Therapy: Patient Spontanous Breathing and Patient connected to nasal cannula oxygen  Post-op Assessment: Report given to RN, Post -op Vital signs reviewed and stable and Patient moving all extremities  Post vital signs: Reviewed and stable  Last Vitals:  Vitals Value Taken Time  BP 172/82 12/11/2017 12:02 PM  Temp    Pulse 112 12/11/2017 12:04 PM  Resp 21 12/11/2017 12:04 PM  SpO2 98 % 12/11/2017 12:04 PM  Vitals shown include unvalidated device data.  Last Pain:  Vitals:   12/11/17 0838  TempSrc: Oral  PainSc:       Patients Stated Pain Goal: 0 (12/10/17 1846)  Complications: No apparent anesthesia complications

## 2017-12-11 NOTE — Anesthesia Postprocedure Evaluation (Signed)
Anesthesia Post Note  Patient: Austin Kent  Procedure(s) Performed: LAPAROSCOPIC  CHOLECYSTECTOMY (N/A Abdomen)     Patient location during evaluation: PACU Anesthesia Type: General Level of consciousness: awake and alert Pain management: pain level controlled Vital Signs Assessment: post-procedure vital signs reviewed and stable Respiratory status: spontaneous breathing, nonlabored ventilation, respiratory function stable and patient connected to nasal cannula oxygen Cardiovascular status: blood pressure returned to baseline and stable Postop Assessment: no apparent nausea or vomiting Anesthetic complications: no    Last Vitals:  Vitals:   12/11/17 1218 12/11/17 1238  BP: (!) 165/76 (!) 171/83  Pulse: (!) 102 97  Resp: (!) 22 19  Temp:    SpO2: 96% 98%    Last Pain:  Vitals:   12/11/17 1230  TempSrc:   PainSc: 0-No pain                 Phillips Grout

## 2017-12-12 ENCOUNTER — Encounter (HOSPITAL_COMMUNITY): Payer: Self-pay | Admitting: General Surgery

## 2017-12-12 LAB — CBC
HCT: 35 % — ABNORMAL LOW (ref 39.0–52.0)
HEMOGLOBIN: 12.2 g/dL — AB (ref 13.0–17.0)
MCH: 30.9 pg (ref 26.0–34.0)
MCHC: 34.9 g/dL (ref 30.0–36.0)
MCV: 88.6 fL (ref 80.0–100.0)
Platelets: 131 10*3/uL — ABNORMAL LOW (ref 150–400)
RBC: 3.95 MIL/uL — ABNORMAL LOW (ref 4.22–5.81)
RDW: 12.1 % (ref 11.5–15.5)
WBC: 10.8 10*3/uL — ABNORMAL HIGH (ref 4.0–10.5)
nRBC: 0 % (ref 0.0–0.2)

## 2017-12-12 LAB — GLUCOSE, CAPILLARY: Glucose-Capillary: 170 mg/dL — ABNORMAL HIGH (ref 70–99)

## 2017-12-12 LAB — COMPREHENSIVE METABOLIC PANEL
ALBUMIN: 3.2 g/dL — AB (ref 3.5–5.0)
ALT: 24 U/L (ref 0–44)
AST: 31 U/L (ref 15–41)
Alkaline Phosphatase: 36 U/L — ABNORMAL LOW (ref 38–126)
Anion gap: 8 (ref 5–15)
BILIRUBIN TOTAL: 1.3 mg/dL — AB (ref 0.3–1.2)
BUN: 14 mg/dL (ref 6–20)
CO2: 21 mmol/L — AB (ref 22–32)
Calcium: 8.3 mg/dL — ABNORMAL LOW (ref 8.9–10.3)
Chloride: 108 mmol/L (ref 98–111)
Creatinine, Ser: 0.92 mg/dL (ref 0.61–1.24)
GFR calc Af Amer: >60 mL/min (ref >60)
GFR calc non Af Amer: >60 mL/min (ref >60)
GLUCOSE: 164 mg/dL — AB (ref 70–99)
POTASSIUM: 4.2 mmol/L (ref 3.5–5.1)
SODIUM: 137 mmol/L (ref 135–145)
TOTAL PROTEIN: 6 g/dL — AB (ref 6.5–8.1)

## 2017-12-12 MED ORDER — CIPROFLOXACIN HCL 500 MG PO TABS: 500.0000 mg | ORAL_TABLET | Freq: Two times a day (BID) | ORAL | Status: DC

## 2017-12-12 MED ORDER — ACETAMINOPHEN 325 MG PO TABS: 1000.0000 mg | ORAL_TABLET | Freq: Four times a day (QID) | ORAL | 0 refills | Status: AC | PRN

## 2017-12-12 MED ORDER — OXYCODONE HCL 5 MG PO TABS: 5.0000 mg | ORAL_TABLET | ORAL | 0 refills | Status: AC | PRN

## 2017-12-12 NOTE — Discharge Instructions (Signed)
[facility] ° °Please arrive at least 30 min before your appointment to complete your check in paperwork.  If you are unable to arrive 30 min prior to your appointment time we may have to cancel or reschedule you. °LAPAROSCOPIC SURGERY: POST OP INSTRUCTIONS °Always review your discharge instruction sheet given to you by the facility where your surgery was performed. °IF YOU HAVE DISABILITY OR FAMILY LEAVE FORMS, YOU MUST BRING THEM TO THE OFFICE FOR PROCESSING.   °DO NOT GIVE THEM TO YOUR DOCTOR. ° °PAIN CONTROL ° °1. First take acetaminophen (Tylenol) AND/or ibuprofen (Advil) to control your pain after surgery.  Follow directions on package.  Taking acetaminophen (Tylenol) and/or ibuprofen (Advil) regularly after surgery will help to control your pain and lower the amount of prescription pain medication you may need.  You should not take more than 4,000 mg (4 grams) of acetaminophen (Tylenol) in 24 hours.  You should not take ibuprofen (Advil), aleve, motrin, naprosyn or other NSAIDS if you have a history of stomach ulcers or chronic kidney disease.  °2. A prescription for pain medication may be given to you upon discharge.  Take your pain medication as prescribed, if you still have uncontrolled pain after taking acetaminophen (Tylenol) or ibuprofen (Advil). °3. Use ice packs to help control pain. °4. If you need a refill on your pain medication, please contact your pharmacy.  They will contact our office to request authorization. Prescriptions will not be filled after 5pm or on week-ends. ° °HOME MEDICATIONS °5. Take your usually prescribed medications unless otherwise directed. ° °DIET °6. You should follow a light diet the first few days after arrival home.  Be sure to include lots of fluids daily. Avoid fatty, fried foods.  ° °CONSTIPATION °7. It is common to experience some constipation after surgery and if you are taking pain medication.  Increasing fluid intake and taking a stool  softener (such as Colace) will usually help or prevent this problem from occurring.  A mild laxative (Milk of Magnesia or Miralax) should be taken according to package instructions if there are no bowel movements after 48 hours. ° °WOUND/INCISION CARE °8. Most patients will experience some swelling and bruising in the area of the incisions.  Ice packs will help.  Swelling and bruising can take several days to resolve.  °9. Unless discharge instructions indicate otherwise, follow guidelines below  °a. STERI-STRIPS - you may remove your outer bandages 48 hours after surgery, and you may shower at that time.  You have steri-strips (small skin tapes) in place directly over the incision.  These strips should be left on the skin for 7-10 days.   °b. DERMABOND/SKIN GLUE - you may shower in 24 hours.  The glue will flake off over the next 2-3 weeks. °10. Any sutures or staples will be removed at the office during your follow-up visit. ° °ACTIVITIES °11. You may resume regular (light) daily activities beginning the next day--such as daily self-care, walking, climbing stairs--gradually increasing activities as tolerated.  You may have sexual intercourse when it is comfortable.  Refrain from any heavy lifting or straining until approved by your doctor. °a. You may drive when you are no longer taking prescription pain medication, you can comfortably wear a seatbelt, and you can safely maneuver your car and apply brakes. ° °FOLLOW-UP °12. You should see your doctor in the office for a follow-up appointment approximately 2-3 weeks after your surgery.  You should have been given your post-op/follow-up appointment when   your surgery was scheduled.  If you did not receive a post-op/follow-up appointment, make sure that you call for this appointment within a day or two after you arrive home to insure a convenient appointment time. ° °OTHER INSTRUCTIONS ° °WHEN TO CALL YOUR DOCTOR: °1. Fever over 101.0 °2. Inability to  urinate °3. Continued bleeding from incision. °4. Increased pain, redness, or drainage from the incision. °5. Increasing abdominal pain ° °The clinic staff is available to answer your questions during regular business hours.  Please don’t hesitate to call and ask to speak to one of the nurses for clinical concerns.  If you have a medical emergency, go to the nearest emergency room or call 911.  A surgeon from [facility] is always on call at the hospital. °[address], [address], [place], [place]  [address] ? [address] [address], [place], [place]   [address] °[phone] ? [phone] ? FAX [phone] ° ° ° °

## 2017-12-12 NOTE — Plan of Care (Signed)
  Problem: Activity: Goal: Risk for activity intolerance will decrease Outcome: Progressing   Problem: Safety: Goal: Ability to remain free from injury will improve Outcome: Progressing   Problem: Skin Integrity: Goal: Risk for impaired skin integrity will decrease Outcome: Progressing   

## 2017-12-12 NOTE — Discharge Summary (Addendum)
     Patient ID: Austin Kent [id number] February 05, 1961 57 y.o.  Admit date: 12/09/2017 Discharge date: 12/12/2017  Admitting Diagnosis: Acute cholecystitis  Discharge Diagnosis Patient Active Problem List   Diagnosis Date Noted  . Right upper quadrant abdominal pain 12/09/2017  . Uncontrolled type 2 diabetes mellitus with hyperglycemia ([facility]) 12/09/2017  . Cholecystitis 07/02/2015  . Nephrolithiasis 06/29/2015  . Cholelithiasis 06/29/2015  . Hypertension 03/18/2012    Consultants none  Reason for Admission: asked to see pt at the request of Dr Dalene Seltzer for 1 day hx of RUQ abdominal pain.  Hx of subtotal cholecystectomy in 2017 by Dr Dwain Sarna.  No hx of pain like this since that time. U/S and CT show recurrent cholecystitis of remnant gallbladder  Procedures Laparoscopic completion cholecystectomy, Dr. Derrell Lolling 12-11-17  Hospital Course:  The patient was admitted and underwent a laparoscopic completion cholecystectomy.  The patient tolerated the procedure well.  On POD 1, the patient was tolerating a regular diet, voiding well, mobilizing, and pain was controlled with oral pain medications.  The patient was stable for DC home at this time with appropriate follow up made.    Physical Exam: Abd: soft, appropriately tender, +BS, ND, incisions c/d/i with dermabond present  Allergies as of 12/12/2017      Reactions   Lisinopril Cough   Amoxicillin Rash   Amoxicillin-pot Clavulanate Rash      Medication List    TAKE these medications   acetaminophen 325 MG tablet Commonly known as:  TYLENOL Take 3 tablets (975 mg total) by mouth every 6 (six) hours as needed for mild pain, fever or headache.   citalopram 20 MG tablet Commonly known as:  CELEXA TAKE 1 TABLET BY MOUTH EVERY DAY   glimepiride 2 MG tablet Commonly known as:  AMARYL Take 6 mg by mouth daily with breakfast.   irbesartan-hydrochlorothiazide 150-12.5 MG tablet Commonly known as:  AVALIDE Take  0.5 tablets by mouth daily.   metFORMIN 1000 MG tablet Commonly known as:  GLUCOPHAGE Take 1,000 mg by mouth 2 (two) times daily.   oxyCODONE 5 MG immediate release tablet Commonly known as:  Oxy IR/ROXICODONE Take 1 tablet (5 mg total) by mouth every 4 (four) hours as needed (5mg  for moderate pain, 10 mg for severe pain).        Follow-up Information    [facility] Follow up on 12/26/2017.   Specialty:  General Surgery Why:  2:30pm, arrive by 2:00 for paperwork and check in Contact information: [address] [place] [address] [phone]           Signed: Barnetta Chapel, [facility] 12/12/2017, 9:58 AM Pager: [phone]

## 2017-12-26 DIAGNOSIS — F419 Anxiety disorder, unspecified: Secondary | ICD-10-CM | POA: Diagnosis not present

## 2017-12-26 DIAGNOSIS — Z23 Encounter for immunization: Secondary | ICD-10-CM | POA: Diagnosis not present

## 2017-12-26 DIAGNOSIS — I10 Essential (primary) hypertension: Secondary | ICD-10-CM | POA: Diagnosis not present

## 2017-12-26 DIAGNOSIS — E119 Type 2 diabetes mellitus without complications: Secondary | ICD-10-CM | POA: Diagnosis not present

## 2018-10-16 DIAGNOSIS — H5213 Myopia, bilateral: Secondary | ICD-10-CM | POA: Diagnosis not present

## 2018-11-26 DIAGNOSIS — Z23 Encounter for immunization: Secondary | ICD-10-CM | POA: Diagnosis not present

## 2019-01-27 DIAGNOSIS — I10 Essential (primary) hypertension: Secondary | ICD-10-CM | POA: Diagnosis not present

## 2019-01-27 DIAGNOSIS — E6609 Other obesity due to excess calories: Secondary | ICD-10-CM | POA: Diagnosis not present

## 2019-01-27 DIAGNOSIS — E119 Type 2 diabetes mellitus without complications: Secondary | ICD-10-CM | POA: Diagnosis not present

## 2019-01-27 DIAGNOSIS — F419 Anxiety disorder, unspecified: Secondary | ICD-10-CM | POA: Diagnosis not present

## 2019-02-05 DIAGNOSIS — I10 Essential (primary) hypertension: Secondary | ICD-10-CM | POA: Diagnosis not present

## 2019-02-05 DIAGNOSIS — E119 Type 2 diabetes mellitus without complications: Secondary | ICD-10-CM | POA: Diagnosis not present

## 2019-02-05 DIAGNOSIS — Z7984 Long term (current) use of oral hypoglycemic drugs: Secondary | ICD-10-CM | POA: Diagnosis not present

## 2019-04-30 IMAGING — NM NM HEPATOBILIARY IMAGE, INC GB
2 series · 12 of 12 positions shown · non-contrast
Comparison: CT scan and ultrasound December 09, 2017.

CLINICAL DATA: Right upper quadrant abdominal pain.

EXAM:
NUCLEAR MEDICINE HEPATOBILIARY IMAGING
TECHNIQUE: Sequential images of the abdomen were obtained [DATE] minutes
following intravenous administration of radiopharmaceutical. 3 mg of
morphine was administered intravenously as well.
RADIOPHARMACEUTICALS:  5.2 mCi Vc-88m  Choletec IV

[he hepatobiliary · 4.52mm/px · 6 of 30 frames shown (1 of 2)]
[frame 3/30]
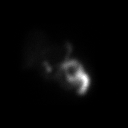
[frame 8/30]
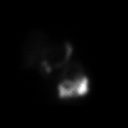
[frame 13/30]
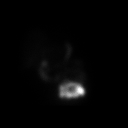
[frame 18/30]
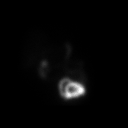
[frame 23/30]
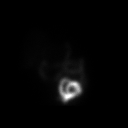
[frame 28/30]
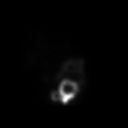

[he hepatobiliary · 4.52mm/px · 6 of 60 frames shown (2 of 2)]
[frame 6/60]
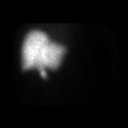
[frame 16/60]
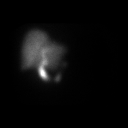
[frame 26/60]
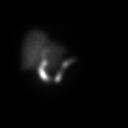
[frame 36/60]
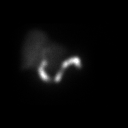
[frame 46/60]
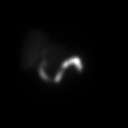
[frame 56/60]
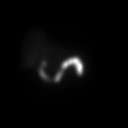

[12 of 12 positions shown; findings below may reference images not displayed]

FINDINGS: Prompt uptake and biliary excretion of activity by the liver is
seen. No definite gallbladder activity is visualized. Biliary
activity passes into small bowel, consistent with patent common bile
duct.
IMPRESSION: No definite gallbladder activity is visualized. Prompt hepatic
uptake and biliary excretion into the common bile duct and small
bowel is noted.
